# Patient Record
Sex: Female | Born: 1965 | Race: White | Hispanic: No | Marital: Married | Smoking: Never smoker
Health system: Southern US, Community
[De-identification: ages and names within clinical notes are randomized; demographics above are authoritative.]

## PROBLEM LIST (undated history)

## (undated) DIAGNOSIS — I10 Essential (primary) hypertension: Secondary | ICD-10-CM

## (undated) DIAGNOSIS — N200 Calculus of kidney: Secondary | ICD-10-CM

## (undated) HISTORY — DX: Calculus of kidney: N20.0

## (undated) HISTORY — DX: Essential (primary) hypertension: I10

---

## 1998-05-03 ENCOUNTER — Other Ambulatory Visit: Admission: RE | Admit: 1998-05-03 | Discharge: 1998-05-03 | Payer: Self-pay | Admitting: Obstetrics and Gynecology

## 1999-05-10 ENCOUNTER — Other Ambulatory Visit: Admission: RE | Admit: 1999-05-10 | Discharge: 1999-05-10 | Payer: Self-pay | Admitting: Obstetrics and Gynecology

## 2000-05-21 ENCOUNTER — Other Ambulatory Visit: Admission: RE | Admit: 2000-05-21 | Discharge: 2000-05-21 | Payer: Self-pay | Admitting: Obstetrics and Gynecology

## 2000-05-23 ENCOUNTER — Encounter: Admission: RE | Admit: 2000-05-23 | Discharge: 2000-05-23 | Payer: Self-pay | Admitting: Obstetrics and Gynecology

## 2000-05-23 ENCOUNTER — Encounter: Payer: Self-pay | Admitting: Obstetrics and Gynecology

## 2001-05-25 ENCOUNTER — Other Ambulatory Visit: Admission: RE | Admit: 2001-05-25 | Discharge: 2001-05-25 | Payer: Self-pay | Admitting: Obstetrics and Gynecology

## 2002-09-01 ENCOUNTER — Inpatient Hospital Stay (HOSPITAL_COMMUNITY): Admission: AD | Admit: 2002-09-01 | Discharge: 2002-09-03 | Payer: Self-pay | Admitting: *Deleted

## 2002-10-12 ENCOUNTER — Other Ambulatory Visit: Admission: RE | Admit: 2002-10-12 | Discharge: 2002-10-12 | Payer: Self-pay | Admitting: Gynecology

## 2003-08-22 ENCOUNTER — Emergency Department (HOSPITAL_COMMUNITY): Admission: EM | Admit: 2003-08-22 | Discharge: 2003-08-22 | Payer: Self-pay | Admitting: Emergency Medicine

## 2003-10-26 ENCOUNTER — Other Ambulatory Visit: Admission: RE | Admit: 2003-10-26 | Discharge: 2003-10-26 | Payer: Self-pay | Admitting: Gynecology

## 2004-04-25 ENCOUNTER — Other Ambulatory Visit: Admission: RE | Admit: 2004-04-25 | Discharge: 2004-04-25 | Payer: Self-pay | Admitting: Gynecology

## 2004-10-26 ENCOUNTER — Other Ambulatory Visit: Admission: RE | Admit: 2004-10-26 | Discharge: 2004-10-26 | Payer: Self-pay | Admitting: Gynecology

## 2005-11-18 ENCOUNTER — Other Ambulatory Visit: Admission: RE | Admit: 2005-11-18 | Discharge: 2005-11-18 | Payer: Self-pay | Admitting: Gynecology

## 2007-02-16 ENCOUNTER — Other Ambulatory Visit: Admission: RE | Admit: 2007-02-16 | Discharge: 2007-02-16 | Payer: Self-pay | Admitting: Gynecology

## 2007-12-09 HISTORY — PX: VAGINAL HYSTERECTOMY: SUR661

## 2008-01-07 ENCOUNTER — Encounter: Payer: Self-pay | Admitting: Gynecology

## 2008-01-07 ENCOUNTER — Ambulatory Visit (HOSPITAL_BASED_OUTPATIENT_CLINIC_OR_DEPARTMENT_OTHER): Admission: RE | Admit: 2008-01-07 | Discharge: 2008-01-07 | Payer: Self-pay | Admitting: Gynecology

## 2008-02-17 ENCOUNTER — Ambulatory Visit: Payer: Self-pay | Admitting: Gynecology

## 2008-11-25 ENCOUNTER — Other Ambulatory Visit: Admission: RE | Admit: 2008-11-25 | Discharge: 2008-11-25 | Payer: Self-pay | Admitting: Gynecology

## 2008-11-25 ENCOUNTER — Encounter: Payer: Self-pay | Admitting: Gynecology

## 2008-11-25 ENCOUNTER — Ambulatory Visit: Payer: Self-pay | Admitting: Gynecology

## 2008-12-20 ENCOUNTER — Ambulatory Visit: Payer: Self-pay | Admitting: Gynecology

## 2009-12-20 ENCOUNTER — Encounter: Admission: RE | Admit: 2009-12-20 | Discharge: 2009-12-20 | Payer: Self-pay | Admitting: Gynecology

## 2009-12-26 ENCOUNTER — Ambulatory Visit: Payer: Self-pay | Admitting: Gynecology

## 2009-12-26 ENCOUNTER — Other Ambulatory Visit: Admission: RE | Admit: 2009-12-26 | Discharge: 2009-12-26 | Payer: Self-pay | Admitting: Gynecology

## 2010-10-23 NOTE — Op Note (Signed)
NAME:  Taylor Woodard, MCMANIGAL          ACCOUNT NO.:  [id number]   MEDICAL RECORD NO.:  [id number]          PATIENT TYPE:  AMB   LOCATION:  [facility]                         FACILITY:  [facility]   PHYSICIAN:  Timothy P. Fontaine, M.D.DATE OF BIRTH:  February 22, 1966   DATE OF PROCEDURE:  01/07/2008  DATE OF DISCHARGE:                               OPERATIVE REPORT   [facility].   PREOPERATIVE DIAGNOSES:  Menorrhagia, uterine prolapse.   POSTOPERATIVE DIAGNOSES:  Menorrhagia, uterine prolapse, leiomyoma.   PROCEDURE:  Total vaginal hysterectomy, McCall culdoplasty.   SURGEON:  Timothy P. Fontaine, M.D.   ASSISTANT:  Rande Brunt. Eda Paschal, M.D.   ANESTHETIC:  General.   ESTIMATED BLOOD LOSS:  50 mL.   COMPLICATIONS:  None.   SPECIMEN:  Uterus to pathology.   FINDINGS:  EUA:  External:  BUS, vagina normal.  Cervix grossly normal.  Uterus normal size, midline and mobile.  Adnexa without masses.  Surgical:  Uterus grossly normal in size with palpable subserosal small  myomas, right and left fallopian tubes grossly normal, right and left  ovaries grossly normal, cul-de-sac to limited inspection was normal.   PROCEDURE:  The patient was taken to the operating room, underwent  general anesthesia, she was placed in the dorsal lithotomy position,  received a perineal/vaginal preparation with Betadine solution.  EUA  performed.  Bladder emptied with indwelling Foley catheterization.  The  patient draped in the usual fashion.  Cervix was grasped with a  tenaculum and the cervical mucosa was circumferentially injected using  lidocaine/epinephrine mixture submucosally, approximately 9 mL.  The  cervical mucosa was then circumferentially incised and the paracervical  plane sharply developed.  The posterior cul-de-sac was then sharply  entered.  A long weighted speculum was placed and the right and left  uterosacral ligaments were identified, clamped, cut and ligated using 0  Vicryl suture  and tagged for future reference.  The anterior cul-de-sac  was then sharply developed and ultimately entered without difficulty,  and the uterus was then progressively freed from its attachments through  clamping, cutting and ligating of the paracervical/parametrial tissues  using 0 Vicryl suture.  The uterine vessels bilaterally were identified,  clamped, cut and doubly ligated using 0 Vicryl suture.  The uterus was  then delivered through the vagina and the uterine/ovarian pedicles were  clamped bilaterally and the specimen was excised from the patient.  The  pedicles were then doubly ligated using 0 Vicryl suture.  The long  weighted speculum was replaced with a shorter weighted speculum and the  intestines were packed from the surgical site using a tagged tail  sponge.  The posterior vaginal cuff was then run from uterosacral  ligament to uterosacral ligament using 0 Vicryl suture in a running  interlocking stitch.  Again, the cul-de-sac again was irrigated,  hemostasis visualized, and a McCall culdoplasty stitch was placed using  2-0 Vicryl and tagged for future tying.  The tail sponge was then  removed, again hemostasis visualized, and the vagina was closed anterior  to posterior using 0 Vicryl suture in interrupted figure-of-eight  stitch.  The culdoplasty stitch was then tied,  the vagina irrigated,  hemostasis visualized, the patient placed in the supine position, clear  free-flowing yellow urine noted, and the patient was awakened without  difficulty and taken to the recovery room in good condition, having  tolerated the procedure well.      Timothy P. Fontaine, M.D.  Electronically Signed     TPF/MEDQ  D:  01/07/2008  T:  01/07/2008  Job:  [id number]

## 2010-10-23 NOTE — H&P (Signed)
NAME:  Taylor Woodard, Taylor Woodard          ACCOUNT NO.:  [id number]   MEDICAL RECORD NO.:  [id number]          PATIENT TYPE:  AMB   LOCATION:  [facility]                         FACILITY:  [facility]   PHYSICIAN:  Timothy P. Fontaine, M.D.DATE OF BIRTH:  June 26, 1965   DATE OF ADMISSION:  01/07/2008  DATE OF DISCHARGE:                              HISTORY & PHYSICAL   Being admitted to [facility] July 30 for  surgery.   CHIEF COMPLAINT:  Menorrhagia, uterine prolapse.   HISTORY OF PRESENT ILLNESS:  [age]year-old G2, P2 female with a  history of worsening menorrhagia, increasing dysmenorrhea with uterine  prolapse for total vaginal hysterectomy.  The patient notes that her  periods are getting heavier, more crampy as well as something protruding  from her vagina when she strains, sits on the toilet and difficulties  keeping tampons in.  Options for management were reviewed to include  nonsurgical such as nonsteroidals, low-dose oral contraceptives, Mirena  IUD, endometrial ablation up to and including hysterectomy.  The patient  wants to proceed with hysterectomy.   PAST MEDICAL HISTORY:  Significant for PMS type symptoms.   PAST SURGICAL HISTORY:  None.   CURRENT MEDICATIONS:  Sarafem 10 mg daily.   ALLERGIES:  PENICILLIN CAUSED A RASH.   REVIEW OF SYSTEMS:  Noncontributory.   FAMILY HISTORY:  Noncontributory.   SOCIAL HISTORY:  Noncontributory.   ADMISSION PHYSICAL EXAM:  Afebrile.  Vital signs stable.  HEENT: Normal.  LUNGS:  Clear.  CARDIAC:  Regular rate without rubs, murmurs or gallops.  ABDOMINAL:  Exam benign.  PELVIC:  External BUS, vagina normal.  Cervix grossly normal with  descent to the introitus with straining.  Uterus retroverted, grossly  normal in size, midline immobile, nontender.  Adnexa without masses or  tenderness.   ASSESSMENT:  [age]-year-old G2, P2 female vasectomy birth control,  increasing dysmenorrhea, menorrhagia and uterine prolapse  for total  vaginal hysterectomy.  The proposed surgery was discussed with the  patient to include alternatives, risks, benefits, indications,  postsurgical nonsurgical, and she wants to proceed with hysterectomy.  Ovarian conservation issue was reviewed with the patient and the options  of keeping both ovaries or removing both ovaries at time of surgery was  discussed.  The patient understands by keeping both ovaries that she  would keep continued hormone production, but would also keep the risk of  ovarian cancer in the future as well as benign ovarian disease requiring  reoperation.  If she removes both ovaries then she would deal with the  issues of hypoestrogenism both from a medical standpoint such as  increased risk of cardiovascular disease or bone health as well as the  issues of symptoms such as hot flashes, sleep disturbances and sweats.  The patient wants to keep both ovaries.  She understands and accepts the  risks of ovarian cancer in the future, although she does give me  permission to remove one or both ovaries if significant disease is  encountered at the time of surgery.  Sexuality following hysterectomy  was also reviewed.  The potential for persistent dyspareunia as well as  orgasmic dysfunction following the  procedure was discussed, understood  and accepted.  The absolute irreversible sterility following  hysterectomy was also discussed, understood and accepted.  The acute  intraoperative postoperative risks were reviewed to include anesthetic  complications, MI, thrombosis, pulmonary embolus and death as well as  the risks of infection requiring prolonged antibiotics, abscess  formation or hematoma formation requiring reoperation, drainage of  abscess or hematomas.  She understands that at any time during the  surgery if it is felt unsafe to proceed with the vaginal approach that  we will switch to an abdominal or laparoscopic approach with abdominal  incisions, that  these could become infected and require opening and  draining and closure by secondary intention.  The long-term issues with  abdominal incisions to include hernia formation and cosmetics was also  discussed.  The risk of hemorrhage necessitating transfusion and the  risks of transfusion including transfusion reaction, hepatitis, HIV, mad  cow disease and other unknown entities were all discussed, understood  and accepted.  The risk of inadvertent injury to internal organs  including bowel, bladder, ureters, vessels and nerves necessitating  major exploratory reparative surgeries, future reparative surgeries,  bladder repair and closure, ureteral surgery, reimplantation, bowel  resection or ostomy formation were all discussed, understood and  accepted.  The patient's questions were answered to her satisfaction and  she is ready to proceed with surgery.      Timothy P. Fontaine, M.D.  Electronically Signed     TPF/MEDQ  D:  01/05/2008  T:  01/05/2008  Job:  [id number]

## 2010-10-26 NOTE — Discharge Summary (Signed)
   NAME:  Taylor Woodard, Taylor Woodard NO.:  [id number]   MEDICAL RECORD NO.:  [id number]                   PATIENT TYPE:  INP   LOCATION:  9104                                 FACILITY:  [facility]   PHYSICIAN:  Ivor Costa. Farrel Gobble, M.D.              DATE OF BIRTH:  May 17, 1966   DATE OF ADMISSION:  09/01/2002  DATE OF DISCHARGE:  09/03/2002                                 DISCHARGE SUMMARY   DISCHARGE DIAGNOSES:  1. Intrauterine pregnancy at 40 weeks, delivered.  2. Status post spontaneous vaginal delivery.  3. Probable allergic reaction to PERCOCET.  4. Positive group B strep.   HISTORY:  A 36-years-of-age female gravida 2 para 1 with an EDC of September 01, 2002.  The patient's prenatal course had been complicated with history of  group B strep; also, advanced maternal age, declined amniocentesis.  Did  develop a thrombosed hemorrhoid which was surgically reduced by Chevis Pretty,  M.D.   HOSPITAL COURSE:  On September 01, 2002 the patient was admitted for induction  at 40 weeks with a favorable cervix.  Subsequently, on September 01, 2002 the  patient underwent a normal spontaneous vaginal delivery of a female, Apgars  of 9 and 9, weight 7 pounds 15 ounces, with a second degree vaginal/perineal  tear which was repaired.  Postpartum the patient did complain, after taking  Ambien and two Percocet, when she awoke she had a swollen uvula.  It was  better after the medication had worn off, but it was thought it was a  probable allergic reaction to Percocet.  The patient otherwise remained  afebrile, voiding, in stable condition, and was thought satisfactory to be  discharged to home on September 03, 2002.   ACCESSORY CLINICAL FINDINGS/LABORATORY DATA:  The patient is O positive,  rubella immune.  On September 02, 2002 hemoglobin 10.7.   DISPOSITION:  1. The patient is discharged to home.  2. Return to the office in six weeks.  3. Given [facility] postpartum instructions and  postpartum     booklet.  4. Informed the patient not to take anymore PERCOCET because thought to be     an allergic reaction to same.  5. If she had any problems prior to the six week visit to be seen in the     office.     Susa Loffler, P.A.                    Ivor Costa. Farrel Gobble, M.D.    TSG/MEDQ  D:  09/24/2002  T:  09/24/2002  Job:  [id number]

## 2010-10-26 NOTE — Discharge Summary (Signed)
NAME:  Taylor Woodard, Taylor Woodard          ACCOUNT NO.:  [id number]   MEDICAL RECORD NO.:  [id number]          PATIENT TYPE:  AMB   LOCATION:  [facility]                         FACILITY:  [facility]   PHYSICIAN:  Timothy P. Fontaine, M.D.DATE OF BIRTH:  1966-01-07   DATE OF ADMISSION:  01/07/2008  DATE OF DISCHARGE:  01/08/2008                               DISCHARGE SUMMARY   DISCHARGE DIAGNOSES:  1. Menorrhagia.  2. Uterine prolapse.  3. Leiomyoma.   PROCEDURE:  Total vaginal hysterectomy on January 07, 2008, pathology  pending.   HOSPITAL COURSE:  A 45 year old female with symptomatic uterine prolapse  and menorrhagia underwent uncomplicated total vaginal hysterectomy on  January 07, 2008.  Findings at the time of surgery was a normal sized  uterus with a small leiomyoma noted.  Her postoperative course was  uncomplicated.  She was discharged on postoperative day #1, ambulating  well, tolerating a regular diet, voiding without difficulty with a  postoperative hemoglobin of 10.6.  The patient was discharged home  having received precautions, instructions and follow-up.  She will be  seen in the office in 2 weeks, and was given a prescription for Dilaudid  2 mg tablets #21 p.o. q.6 h., p.r.n. pain.  She initially was prescribed  Tylox, but gave a history of questionable throat tightening on a prior  exposure, and the Dilaudid was substituted for the Tylox.      Timothy P. Fontaine, M.D.  Electronically Signed     TPF/MEDQ  D:  01/08/2008  T:  01/08/2008  Job:  [id number]

## 2010-10-26 NOTE — H&P (Signed)
   NAME:  Taylor Woodard, Taylor Woodard NO.:  [id number]   MEDICAL RECORD NO.:  [id number]                   PATIENT TYPE:  MAT   LOCATION:  [facility]                                 FACILITY:  [facility]   PHYSICIAN:  Katy Fitch, M.D.               DATE OF BIRTH:  Nov 16, 1965   DATE OF ADMISSION:  08/31/2002  DATE OF DISCHARGE:                                HISTORY & PHYSICAL   CHIEF COMPLAINT:  40 weeks with favorable cervix.   HISTORY OF PRESENT ILLNESS:  The patient is a 45 year old, G2, P1, at 40  weeks and two days, who was admitted for induction secondary to a favorable  cervix.  The patient's only prenatal complication was that she had a  thrombosed hemorrhoid which was surgically fixed by Ollen Gross. Carolynne Edouard, M.D., and  has had no problems since the 36-week visit.  The patient has advanced  maternal age, but declined amniocentesis.  She had a positive GBS culture.   PAST MEDICAL HISTORY:  Thrombosed hemorrhoid.   PAST SURGICAL HISTORY:  Surgical correction of the thrombosed hemorrhoid.   MEDICATIONS:  Prenatal vitamins.   ALLERGIES:  No known drug allergies.   SOCIAL HISTORY:  Without any tobacco, alcohol, or drugs.   FAMILY HISTORY:  Without any mental retardation or epithelial cancers.   PHYSICAL EXAMINATION:  VITAL SIGNS:  Blood pressure 112/70.  HEENT:  Throat clear.  LUNGS:  Clear to auscultation bilaterally.  HEART:  Regular rate and rhythm.  ABDOMEN:  Gravid and nontender.  Estimated fetal weight 8 pounds even.  PELVIC:  Cervix 280 and -1.   ASSESSMENT AND PLAN:  A 45 year old, G2, P1, at 40 weeks with favorable  cervix.  Admit for Pitocin induction.  Will start penicillin for GBS  prophylaxis and anticipate a spontaneous vaginal delivery.                                               Katy Fitch, M.D.    DC/MEDQ  D:  08/31/2002  T:  08/31/2002  Job:  [id number]

## 2010-12-04 ENCOUNTER — Other Ambulatory Visit: Payer: Self-pay | Admitting: Gynecology

## 2010-12-04 DIAGNOSIS — Z1231 Encounter for screening mammogram for malignant neoplasm of breast: Secondary | ICD-10-CM

## 2010-12-24 ENCOUNTER — Ambulatory Visit
Admission: RE | Admit: 2010-12-24 | Discharge: 2010-12-24 | Disposition: A | Discharge status: Home / Self Care | Payer: BC Managed Care – PPO | Source: Ambulatory Visit | Attending: Gynecology | Admitting: Gynecology

## 2010-12-24 DIAGNOSIS — Z1231 Encounter for screening mammogram for malignant neoplasm of breast: Secondary | ICD-10-CM

## 2010-12-26 ENCOUNTER — Other Ambulatory Visit: Payer: Self-pay | Admitting: Gynecology

## 2010-12-26 DIAGNOSIS — R928 Other abnormal and inconclusive findings on diagnostic imaging of breast: Secondary | ICD-10-CM

## 2011-01-01 ENCOUNTER — Other Ambulatory Visit (HOSPITAL_COMMUNITY)
Admission: RE | Admit: 2011-01-01 | Discharge: 2011-01-01 | Disposition: A | Discharge status: Home / Self Care | Payer: BC Managed Care – PPO | Source: Ambulatory Visit | Attending: Gynecology | Admitting: Gynecology

## 2011-01-01 ENCOUNTER — Encounter: Payer: Self-pay | Admitting: Gynecology

## 2011-01-01 ENCOUNTER — Ambulatory Visit
Admission: RE | Admit: 2011-01-01 | Discharge: 2011-01-01 | Disposition: A | Discharge status: Home / Self Care | Payer: BC Managed Care – PPO | Source: Ambulatory Visit | Attending: Gynecology | Admitting: Gynecology

## 2011-01-01 ENCOUNTER — Ambulatory Visit (INDEPENDENT_AMBULATORY_CARE_PROVIDER_SITE_OTHER): Payer: BC Managed Care – PPO | Admitting: Gynecology

## 2011-01-01 VITALS — BP 112/70 | Ht 63.75 in | Wt 149.0 lb

## 2011-01-01 DIAGNOSIS — R823 Hemoglobinuria: Secondary | ICD-10-CM

## 2011-01-01 DIAGNOSIS — Z13220 Encounter for screening for lipoid disorders: Secondary | ICD-10-CM

## 2011-01-01 DIAGNOSIS — F419 Anxiety disorder, unspecified: Secondary | ICD-10-CM

## 2011-01-01 DIAGNOSIS — Z01419 Encounter for gynecological examination (general) (routine) without abnormal findings: Secondary | ICD-10-CM

## 2011-01-01 DIAGNOSIS — Z124 Encounter for screening for malignant neoplasm of cervix: Secondary | ICD-10-CM

## 2011-01-01 DIAGNOSIS — R928 Other abnormal and inconclusive findings on diagnostic imaging of breast: Secondary | ICD-10-CM

## 2011-01-01 DIAGNOSIS — Z833 Family history of diabetes mellitus: Secondary | ICD-10-CM

## 2011-01-01 MED ORDER — FLUOXETINE HCL 10 MG PO CAPS: 10.0000 mg | ORAL_CAPSULE | Freq: Every day | ORAL | Status: DC

## 2011-01-01 NOTE — Progress Notes (Signed)
Addended by: Cammie Mcgee T on: 01/01/2011 04:24 PM   Modules accepted: Orders

## 2011-01-01 NOTE — Progress Notes (Signed)
Taylor Woodard 08-Aug-1965 [id number]    History:    The patient presents for annual exam.  She is otherwise doing well on fluoxetine 10 mg daily without complaints.   Past medical history, past surgical history, family history and social history were all reviewed and documented in the EPIC chart.   ROS:  A 14 point ROS was performed and pertinent positives and negatives are included in the history.  Exam:  Filed Vitals:   01/01/11 1054  BP: 112/70    General appearance:  Normal Head/Neck:  Normal, without cervical or supraclavicular adenopathy. Thyroid:  Symmetrical, normal in size, without palpable masses or nodularity. Respiratory  Effort:  Normal  Auscultation:  Clear without wheezing or rhonchi Cardiovascular  Auscultation:  Regular rate, without rubs, murmurs or gallops  Edema/varicosities:  Not grossly evident Abdominal  Masses/tenderness:  Soft,nontender, without masses, guarding or rebound.  Liver/spleen:  No organomegaly noted  Hernia:  None appreciated  Occult test:   Skin  Inspection:  Grossly normal  Palpation:  Grossly normal Neurologic/psychiatric  Orientation:  Normal with appropriate conversation.  Mood/affect:  Normal  Genitourinary with chaperone present    Breasts: Examined lying and sitting.     Right: Without masses, retractions, discharge or axillary adenopathy.     Left: Without masses, retractions, discharge or axillary adenopathy.   Inguinal/mons:  Normal without inguinal adenopathy  External genitalia:  Normal  BUS/Urethra/Skene's glands:  Normal  Bladder:  Normal  Vagina:  Normal  Cervix:  absent  Uterus:  absent  Adnexa/parametria:     Rt: Without masses or tenderness.   Lt: Without masses or tenderness.  Anus and perineum: Normal  Digital rectal exam: Normal sphincter tone without palpated masses or tenderness.   Assessment/Plan:  44 y.o. year old female for annual exam doing well.  I will refill her fluoxetine 10 mg daily if  she's doing well with this x1 year. Recently had screening mammogram and as a call back for relook on her right breast and she has a scheduled followup with them. Her breast exam today is normal she'll continue with self breast exams on monthly basis.  Does have a maternal history of colon cancer and actually we'll be planning a colonoscopy within the next year. Increase calcium vitamin D reviewed. Since she continues well then we'll see her in a year sooner as needed    Dara Lords MD, 11:20 AM 01/01/2011

## 2011-03-08 LAB — CBC
MCV: 88.6
Platelets: 326
RBC: 3.56 — ABNORMAL LOW
WBC: 13 — ABNORMAL HIGH

## 2011-11-26 ENCOUNTER — Other Ambulatory Visit: Payer: Self-pay | Admitting: Gynecology

## 2011-11-26 DIAGNOSIS — Z1231 Encounter for screening mammogram for malignant neoplasm of breast: Secondary | ICD-10-CM

## 2011-12-25 ENCOUNTER — Ambulatory Visit
Admission: RE | Admit: 2011-12-25 | Discharge: 2011-12-25 | Disposition: A | Discharge status: Home / Self Care | Payer: BC Managed Care – PPO | Source: Ambulatory Visit | Attending: Gynecology | Admitting: Gynecology

## 2011-12-25 DIAGNOSIS — Z1231 Encounter for screening mammogram for malignant neoplasm of breast: Secondary | ICD-10-CM

## 2012-01-02 ENCOUNTER — Ambulatory Visit (INDEPENDENT_AMBULATORY_CARE_PROVIDER_SITE_OTHER): Payer: BC Managed Care – PPO | Admitting: Gynecology

## 2012-01-02 ENCOUNTER — Encounter: Payer: BC Managed Care – PPO | Admitting: Gynecology

## 2012-01-02 ENCOUNTER — Encounter: Payer: Self-pay | Admitting: Gynecology

## 2012-01-02 VITALS — BP 120/74 | Ht 63.5 in | Wt 147.0 lb

## 2012-01-02 DIAGNOSIS — F411 Generalized anxiety disorder: Secondary | ICD-10-CM

## 2012-01-02 DIAGNOSIS — Z01419 Encounter for gynecological examination (general) (routine) without abnormal findings: Secondary | ICD-10-CM

## 2012-01-02 DIAGNOSIS — F419 Anxiety disorder, unspecified: Secondary | ICD-10-CM

## 2012-01-02 MED ORDER — FLUOXETINE HCL 10 MG PO CAPS: 10.0000 mg | ORAL_CAPSULE | Freq: Every day | ORAL | Status: DC

## 2012-01-02 NOTE — Patient Instructions (Signed)
Follow up in one year for annual gynecologic exam. 

## 2012-01-02 NOTE — Progress Notes (Signed)
Taylor Woodard 1966/03/26 [id number]        46 y.o.  [id number] for annual exam.  No complaints.  Past medical history,surgical history, medications, allergies, family history and social history were all reviewed and documented in the EPIC chart. ROS:  Was performed and pertinent positives and negatives are included in the history.  Exam: [name] assistant Filed Vitals:   01/02/12 0948  BP: 120/74  Height: 5' 3.5" (1.613 m)  Weight: 147 lb (66.679 kg)   General appearance  Normal Skin grossly normal Head/Neck normal with no cervical or supraclavicular adenopathy thyroid normal Lungs  Clear, small cutaneous nodule upper anterior left chest skin Cardiac RR, without RMG Abdominal  soft, nontender, without masses, organomegaly or hernia Breasts  examined lying and sitting without masses, retractions, discharge or axillary adenopathy. Pelvic  Ext/BUS/vagina  normal   Adnexa  Without masses or tenderness    Anus and perineum  normal   Rectovaginal  normal sphincter tone without palpated masses or tenderness.    Assessment/Plan:  46 y.o. [id number] female for annual exam.   1. Status post TVH for menorrhagia/prolapse. Doing well. 2. Mild anxiety. On fluoxetine 10 mg doing well wants to continue. Refill x1 year. 3. Small cutaneous nodule left anterior chest skin. Patient had evaluated and was told it was a hemangioma. She'll continue to monitor. 4. Pap smear. Last Pap smear 2012. No history of abnormal Pap smears before. Review current screening guidelines. She is status post hysterectomy for benign indications. No Pap done today, plan no further screening/less frequent interval and we'll rediscuss annually. 5. Mammography.  Patient just had her mammogram. We'll continue with annual mammography. SBE monthly reviewed. 6. Colonoscopy. Patient's mother died of colon cancer diagnosed age 63.  Recommended start screening at age 19. 7. Health maintenance. Patient had a normal glucose, lipid profile,  CBC urinalysis last year. Will not repeat this year. She will see me in a year, sooner as needed.    Dara Lords MD, 10:16 AM 01/02/2012

## 2012-01-03 LAB — URINALYSIS W MICROSCOPIC + REFLEX CULTURE
Casts: NONE SEEN
Glucose, UA: NEGATIVE mg/dL
Leukocytes, UA: NEGATIVE
Nitrite: NEGATIVE
Protein, ur: NEGATIVE mg/dL
Squamous Epithelial / LPF: NONE SEEN
pH: 5.5 (ref 5.0–8.0)

## 2012-01-11 ENCOUNTER — Other Ambulatory Visit: Payer: Self-pay | Admitting: Gynecology

## 2012-04-24 ENCOUNTER — Ambulatory Visit
Admission: RE | Admit: 2012-04-24 | Discharge: 2012-04-24 | Disposition: A | Discharge status: Home / Self Care | Payer: BC Managed Care – PPO | Source: Ambulatory Visit | Attending: Family Medicine | Admitting: Family Medicine

## 2012-04-24 ENCOUNTER — Other Ambulatory Visit: Payer: Self-pay | Admitting: Family Medicine

## 2012-04-24 DIAGNOSIS — M549 Dorsalgia, unspecified: Secondary | ICD-10-CM

## 2012-04-24 DIAGNOSIS — R109 Unspecified abdominal pain: Secondary | ICD-10-CM

## 2012-11-11 ENCOUNTER — Ambulatory Visit
Admission: RE | Admit: 2012-11-11 | Discharge: 2012-11-11 | Disposition: A | Discharge status: Home / Self Care | Payer: BC Managed Care – PPO | Source: Ambulatory Visit

## 2012-11-11 ENCOUNTER — Other Ambulatory Visit: Payer: Self-pay

## 2012-11-11 DIAGNOSIS — N200 Calculus of kidney: Secondary | ICD-10-CM

## 2012-11-11 DIAGNOSIS — R52 Pain, unspecified: Secondary | ICD-10-CM

## 2012-12-04 ENCOUNTER — Other Ambulatory Visit: Payer: Self-pay

## 2012-12-04 DIAGNOSIS — Z1231 Encounter for screening mammogram for malignant neoplasm of breast: Secondary | ICD-10-CM

## 2012-12-28 ENCOUNTER — Ambulatory Visit
Admission: RE | Admit: 2012-12-28 | Discharge: 2012-12-28 | Disposition: A | Discharge status: Home / Self Care | Payer: BC Managed Care – PPO | Source: Ambulatory Visit

## 2012-12-28 DIAGNOSIS — Z1231 Encounter for screening mammogram for malignant neoplasm of breast: Secondary | ICD-10-CM

## 2013-01-07 ENCOUNTER — Encounter: Payer: Self-pay | Admitting: Gynecology

## 2013-01-07 ENCOUNTER — Ambulatory Visit (INDEPENDENT_AMBULATORY_CARE_PROVIDER_SITE_OTHER): Payer: BC Managed Care – PPO | Admitting: Gynecology

## 2013-01-07 ENCOUNTER — Other Ambulatory Visit: Payer: Self-pay

## 2013-01-07 VITALS — BP 140/88 | Ht 64.0 in | Wt 130.0 lb

## 2013-01-07 DIAGNOSIS — Z01419 Encounter for gynecological examination (general) (routine) without abnormal findings: Secondary | ICD-10-CM

## 2013-01-07 DIAGNOSIS — Z13220 Encounter for screening for lipoid disorders: Secondary | ICD-10-CM

## 2013-01-07 DIAGNOSIS — N83209 Unspecified ovarian cyst, unspecified side: Secondary | ICD-10-CM

## 2013-01-07 LAB — CBC WITH DIFFERENTIAL/PLATELET
Basophils Absolute: 0 10*3/uL (ref 0.0–0.1)
Basophils Relative: 1 % (ref 0–1)
Eosinophils Absolute: 0.1 10*3/uL (ref 0.0–0.7)
Eosinophils Relative: 2 % (ref 0–5)
HCT: 42.8 % (ref 36.0–46.0)
Lymphocytes Relative: 20 % (ref 12–46)
MCHC: 33.9 g/dL (ref 30.0–36.0)
MCV: 90.7 fL (ref 78.0–100.0)
Monocytes Absolute: 0.5 10*3/uL (ref 0.1–1.0)
Platelets: 320 10*3/uL (ref 150–400)
RDW: 13.5 % (ref 11.5–15.5)
WBC: 6.5 10*3/uL (ref 4.0–10.5)

## 2013-01-07 LAB — COMPREHENSIVE METABOLIC PANEL
ALT: 10 U/L (ref 0–35)
AST: 13 U/L (ref 0–37)
Alkaline Phosphatase: 75 U/L (ref 39–117)
BUN: 17 mg/dL (ref 6–23)
Calcium: 9.4 mg/dL (ref 8.4–10.5)
Creat: 0.63 mg/dL (ref 0.50–1.10)
Total Bilirubin: 0.5 mg/dL (ref 0.3–1.2)

## 2013-01-07 LAB — LIPID PANEL
HDL: 63 mg/dL (ref >39)
LDL Cholesterol: 102 mg/dL — ABNORMAL HIGH (ref 0–99)
Total CHOL/HDL Ratio: 2.8 Ratio
VLDL: 9 mg/dL (ref 0–40)

## 2013-01-07 LAB — TSH: TSH: 1.98 u[IU]/mL (ref 0.350–4.500)

## 2013-01-07 MED ORDER — FLUOXETINE HCL 10 MG PO CAPS: 10.0000 mg | ORAL_CAPSULE | Freq: Every day | ORAL | Status: DC

## 2013-01-07 NOTE — Patient Instructions (Signed)
follow up for ultrasound as scheduled Monitor blood pressure.  If it remains elevated follow up with your primary care MD

## 2013-01-07 NOTE — Progress Notes (Signed)
Taylor Woodard 1965-08-16 [id number]        47 y.o.  [id number] for annual exam.  Several issues noted below.  Past medical history,surgical history, medications, allergies, family history and social history were all reviewed and documented in the EPIC chart.  ROS:  Performed and pertinent positives and negatives are included in the history, assessment and plan .  Exam: [name] assistant Filed Vitals:   01/07/13 0937  BP: 140/88  Height: 5\' 4"  (1.626 m)  Weight: 130 lb (58.968 kg)   General appearance  Normal Skin grossly normal Head/Neck normal with no cervical or supraclavicular adenopathy thyroid normal Lungs  clear Cardiac RR, without RMG Abdominal  soft, nontender, without masses, organomegaly or hernia Breasts  examined lying and sitting without masses, retractions, discharge or axillary adenopathy. Pelvic  Ext/BUS/vagina  normal    Adnexa  Without masses or tenderness    Anus and perineum  normal   Rectovaginal  normal sphincter tone without palpated masses or tenderness.    Assessment/Plan:  47 y.o. [id number] female for annual exam.   1. Borderline hypertension. Her pressure is 140/88. She noticed it was elevated the last time she had it checked at another facility. Recommended she buy a cuff and follow at home. It remains elevated she needs to follow up with her primary physician for further evaluation and possible treatment. 2. Left ovarian cyst. Patient had recent CT scan for evaluation of abdominal pain. Was found to have renal calculi but also to probable left ovarian cysts. There's her pain is improving. Recommend ultrasound now for resolution/better definition of the adnexa. Patient will schedule a followup for this. 3. Anxiety. Patient continues on Lexapro 10 mg daily doing well and wants to continue. I refilled her x1 year. 4. Pap smear 2012. No Pap smear done today. No history of abnormal Pap smears. Status post hysterectomy for benign indications. Options to stop  screening altogether versus less frequent screening intervals reviewed. We'll readdress on an annual basis. 5. Mammography 12/2012. Continue with annual mammography. SBE monthly review. 6. Mother is history of colon cancer diagnosis/died in her 73s. Recommended she followup with gastroenterology now and asked them when to start screening. She has already thought about this and is going to see Dr. Kinnie Scales. 7. Health maintenance. Baseline CBC lipid profile comprehensive metabolic panel urinalysis and TSH ordered. Followup for ultrasound, otherwise annually  Note: This document was prepared with digital dictation and possible smart phrase technology. Any transcriptional errors that result from this process are unintentional.   Dara Lords MD, 10:04 AM 01/07/2013

## 2013-01-08 LAB — URINALYSIS W MICROSCOPIC + REFLEX CULTURE
Glucose, UA: NEGATIVE mg/dL
Hgb urine dipstick: NEGATIVE
Leukocytes, UA: NEGATIVE
Protein, ur: NEGATIVE mg/dL
Urobilinogen, UA: 0.2 mg/dL (ref 0.0–1.0)

## 2013-01-20 ENCOUNTER — Encounter: Payer: Self-pay | Admitting: Gynecology

## 2013-01-20 ENCOUNTER — Other Ambulatory Visit: Payer: Self-pay | Admitting: Gynecology

## 2013-01-20 ENCOUNTER — Ambulatory Visit (INDEPENDENT_AMBULATORY_CARE_PROVIDER_SITE_OTHER): Payer: BC Managed Care – PPO | Admitting: Gynecology

## 2013-01-20 ENCOUNTER — Ambulatory Visit (INDEPENDENT_AMBULATORY_CARE_PROVIDER_SITE_OTHER): Payer: BC Managed Care – PPO

## 2013-01-20 DIAGNOSIS — R102 Pelvic and perineal pain: Secondary | ICD-10-CM

## 2013-01-20 DIAGNOSIS — N83209 Unspecified ovarian cyst, unspecified side: Secondary | ICD-10-CM

## 2013-01-20 DIAGNOSIS — N831 Corpus luteum cyst of ovary, unspecified side: Secondary | ICD-10-CM

## 2013-01-20 DIAGNOSIS — N8300 Follicular cyst of ovary, unspecified side: Secondary | ICD-10-CM

## 2013-01-20 DIAGNOSIS — N839 Noninflammatory disorder of ovary, fallopian tube and broad ligament, unspecified: Secondary | ICD-10-CM

## 2013-01-20 DIAGNOSIS — N949 Unspecified condition associated with female genital organs and menstrual cycle: Secondary | ICD-10-CM

## 2013-01-20 NOTE — Progress Notes (Signed)
Patient presents for followup ultrasound. Had CT scan early June for abdominal pain where they found renal lithiasis but also described to probable left ovarian cysts measuring 30 and 26 mm. Her pain has improved but has not totally gone away. More nagging in nature.  Ultrasound today status post hysterectomy shows right ovarian echo-free follicle 16 x 9 mm. Left ovary with thick walled cyst with peripheral flow 25 mm suggestive of a corpus luteum. Cul-de-sac negative.  Assessment and plan: Probable bilateral physiologic changes in her ovaries. Left ovarian corpus luteum smaller than what was described in June. Did discuss possible endometrioma. Options to include laparoscopy now due to her discomfort rule out endometriosis, observation with followup if pain persists for more aggressive approach or repeat ultrasound in 2 months relook at her ovary to make sure all changes result in if not or pain persists laparoscopy. Patient would prefer observation and followup if her pain would persist I think this is certainly reasonable. Otherwise if her pain does resolve then she'll followup when she is due for her annual exam.

## 2013-01-20 NOTE — Patient Instructions (Signed)
Call if your pain persists. Otherwise followup routinely.

## 2013-11-23 ENCOUNTER — Other Ambulatory Visit: Payer: Self-pay

## 2013-11-23 DIAGNOSIS — Z1231 Encounter for screening mammogram for malignant neoplasm of breast: Secondary | ICD-10-CM

## 2014-01-03 ENCOUNTER — Ambulatory Visit
Admission: RE | Admit: 2014-01-03 | Discharge: 2014-01-03 | Disposition: A | Discharge status: Home / Self Care | Payer: BC Managed Care – PPO | Source: Ambulatory Visit

## 2014-01-03 DIAGNOSIS — Z1231 Encounter for screening mammogram for malignant neoplasm of breast: Secondary | ICD-10-CM

## 2014-01-04 ENCOUNTER — Other Ambulatory Visit: Payer: Self-pay | Admitting: Gynecology

## 2014-01-04 DIAGNOSIS — R928 Other abnormal and inconclusive findings on diagnostic imaging of breast: Secondary | ICD-10-CM

## 2014-01-10 ENCOUNTER — Ambulatory Visit (INDEPENDENT_AMBULATORY_CARE_PROVIDER_SITE_OTHER): Payer: BC Managed Care – PPO | Admitting: Gynecology

## 2014-01-10 ENCOUNTER — Ambulatory Visit
Admission: RE | Admit: 2014-01-10 | Discharge: 2014-01-10 | Disposition: A | Discharge status: Home / Self Care | Payer: BC Managed Care – PPO | Source: Ambulatory Visit | Attending: Gynecology | Admitting: Gynecology

## 2014-01-10 ENCOUNTER — Other Ambulatory Visit (HOSPITAL_COMMUNITY)
Admission: RE | Admit: 2014-01-10 | Discharge: 2014-01-10 | Disposition: A | Discharge status: Home / Self Care | Payer: BC Managed Care – PPO | Source: Ambulatory Visit | Attending: Gynecology | Admitting: Gynecology

## 2014-01-10 ENCOUNTER — Encounter: Payer: Self-pay | Admitting: Gynecology

## 2014-01-10 VITALS — BP 118/74 | Ht 64.0 in | Wt 136.0 lb

## 2014-01-10 DIAGNOSIS — R928 Other abnormal and inconclusive findings on diagnostic imaging of breast: Secondary | ICD-10-CM

## 2014-01-10 DIAGNOSIS — Z01419 Encounter for gynecological examination (general) (routine) without abnormal findings: Secondary | ICD-10-CM

## 2014-01-10 MED ORDER — FLUOXETINE HCL 10 MG PO CAPS: 10.0000 mg | ORAL_CAPSULE | Freq: Every day | ORAL | Status: AC

## 2014-01-10 NOTE — Addendum Note (Signed)
Addended by: Dayna BarkerGARDNER, Aurora Rody K on: 01/10/2014 11:02 AM   Modules accepted: Orders

## 2014-01-10 NOTE — Patient Instructions (Signed)
Followup in one year for annual exam, sooner as needed.  You may obtain a copy of any labs that were done today by logging onto MyChart as outlined in the instructions provided with your AVS (after visit summary). The office will not call with normal lab results but certainly if there are any significant abnormalities then we will contact you.   Health Maintenance, Female A healthy lifestyle and preventative care can promote health and wellness.  Maintain regular health, dental, and eye exams.  Eat a healthy diet. Foods like vegetables, fruits, whole grains, low-fat dairy products, and lean protein foods contain the nutrients you need without too many calories. Decrease your intake of foods high in solid fats, added sugars, and salt. Get information about a proper diet from your caregiver, if necessary.  Regular physical exercise is one of the most important things you can do for your health. Most adults should get at least 150 minutes of moderate-intensity exercise (any activity that increases your heart rate and causes you to sweat) each week. In addition, most adults need muscle-strengthening exercises on 2 or more days a week.   Maintain a healthy weight. The body mass index (BMI) is a screening tool to identify possible weight problems. It provides an estimate of body fat based on height and weight. Your caregiver can help determine your BMI, and can help you achieve or maintain a healthy weight. For adults 20 years and older:  A BMI below 18.5 is considered underweight.  A BMI of 18.5 to 24.9 is normal.  A BMI of 25 to 29.9 is considered overweight.  A BMI of 30 and above is considered obese.  Maintain normal blood lipids and cholesterol by exercising and minimizing your intake of saturated fat. Eat a balanced diet with plenty of fruits and vegetables. Blood tests for lipids and cholesterol should begin at age 28 and be repeated every 5 years. If your lipid or cholesterol levels are  high, you are over 50, or you are a high risk for heart disease, you may need your cholesterol levels checked more frequently.Ongoing high lipid and cholesterol levels should be treated with medicines if diet and exercise are not effective.  If you smoke, find out from your caregiver how to quit. If you do not use tobacco, do not start.  Lung cancer screening is recommended for adults aged 25 80 years who are at high risk for developing lung cancer because of a history of smoking. Yearly low-dose computed tomography (CT) is recommended for people who have at least a 30-pack-year history of smoking and are a current smoker or have quit within the past 15 years. A pack year of smoking is smoking an average of 1 pack of cigarettes a day for 1 year (for example: 1 pack a day for 30 years or 2 packs a day for 15 years). Yearly screening should continue until the smoker has stopped smoking for at least 15 years. Yearly screening should also be stopped for people who develop a health problem that would prevent them from having lung cancer treatment.  If you are pregnant, do not drink alcohol. If you are breastfeeding, be very cautious about drinking alcohol. If you are not pregnant and choose to drink alcohol, do not exceed 1 drink per day. One drink is considered to be 12 ounces (355 mL) of beer, 5 ounces (148 mL) of wine, or 1.5 ounces (44 mL) of liquor.  Avoid use of street drugs. Do not share needles with anyone.  Ask for help if you need support or instructions about stopping the use of drugs.  High blood pressure causes heart disease and increases the risk of stroke. Blood pressure should be checked at least every 1 to 2 years. Ongoing high blood pressure should be treated with medicines, if weight loss and exercise are not effective.  If you are 88 to 47 years old, ask your caregiver if you should take aspirin to prevent strokes.  Diabetes screening involves taking a blood sample to check your fasting  blood sugar level. This should be done once every 3 years, after age 77, if you are within normal weight and without risk factors for diabetes. Testing should be considered at a younger age or be carried out more frequently if you are overweight and have at least 1 risk factor for diabetes.  Breast cancer screening is essential preventative care for women. You should practice "breast self-awareness." This means understanding the normal appearance and feel of your breasts and may include breast self-examination. Any changes detected, no matter how small, should be reported to a caregiver. Women in their 26s and 30s should have a clinical breast exam (CBE) by a caregiver as part of a regular health exam every 1 to 3 years. After age 32, women should have a CBE every year. Starting at age 34, women should consider having a mammogram (breast X-ray) every year. Women who have a family history of breast cancer should talk to their caregiver about genetic screening. Women at a high risk of breast cancer should talk to their caregiver about having an MRI and a mammogram every year.  Breast cancer gene (BRCA)-related cancer risk assessment is recommended for women who have family members with BRCA-related cancers. BRCA-related cancers include breast, ovarian, tubal, and peritoneal cancers. Having family members with these cancers may be associated with an increased risk for harmful changes (mutations) in the breast cancer genes BRCA1 and BRCA2. Results of the assessment will determine the need for genetic counseling and BRCA1 and BRCA2 testing.  The Pap test is a screening test for cervical cancer. Women should have a Pap test starting at age 28. Between ages 48 and 67, Pap tests should be repeated every 2 years. Beginning at age 20, you should have a Pap test every 3 years as long as the past 3 Pap tests have been normal. If you had a hysterectomy for a problem that was not cancer or a condition that could lead to  cancer, then you no longer need Pap tests. If you are between ages 55 and 63, and you have had normal Pap tests going back 10 years, you no longer need Pap tests. If you have had past treatment for cervical cancer or a condition that could lead to cancer, you need Pap tests and screening for cancer for at least 20 years after your treatment. If Pap tests have been discontinued, risk factors (such as a new sexual partner) need to be reassessed to determine if screening should be resumed. Some women have medical problems that increase the chance of getting cervical cancer. In these cases, your caregiver may recommend more frequent screening and Pap tests.  The human papillomavirus (HPV) test is an additional test that may be used for cervical cancer screening. The HPV test looks for the virus that can cause the cell changes on the cervix. The cells collected during the Pap test can be tested for HPV. The HPV test could be used to screen women aged 28 years  and older, and should be used in women of any age who have unclear Pap test results. After the age of 85, women should have HPV testing at the same frequency as a Pap test.  Colorectal cancer can be detected and often prevented. Most routine colorectal cancer screening begins at the age of 61 and continues through age 55. However, your caregiver may recommend screening at an earlier age if you have risk factors for colon cancer. On a yearly basis, your caregiver may provide home test kits to check for hidden blood in the stool. Use of a small camera at the end of a tube, to directly examine the colon (sigmoidoscopy or colonoscopy), can detect the earliest forms of colorectal cancer. Talk to your caregiver about this at age 56, when routine screening begins. Direct examination of the colon should be repeated every 5 to 10 years through age 20, unless early forms of pre-cancerous polyps or small growths are found.  Hepatitis C blood testing is recommended for  all people born from [birth year] through 1964 and any individual with known risks for hepatitis C.  Practice safe sex. Use condoms and avoid high-risk sexual practices to reduce the spread of sexually transmitted infections (STIs). Sexually active women aged 68 and younger should be checked for Chlamydia, which is a common sexually transmitted infection. Older women with new or multiple partners should also be tested for Chlamydia. Testing for other STIs is recommended if you are sexually active and at increased risk.  Osteoporosis is a disease in which the bones lose minerals and strength with aging. This can result in serious bone fractures. The risk of osteoporosis can be identified using a bone density scan. Women ages 42 and over and women at risk for fractures or osteoporosis should discuss screening with their caregivers. Ask your caregiver whether you should be taking a calcium supplement or vitamin D to reduce the rate of osteoporosis.  Menopause can be associated with physical symptoms and risks. Hormone replacement therapy is available to decrease symptoms and risks. You should talk to your caregiver about whether hormone replacement therapy is right for you.  Use sunscreen. Apply sunscreen liberally and repeatedly throughout the day. You should seek shade when your shadow is shorter than you. Protect yourself by wearing long sleeves, pants, a wide-brimmed hat, and sunglasses year round, whenever you are outdoors.  Notify your caregiver of new moles or changes in moles, especially if there is a change in shape or color. Also notify your caregiver if a mole is larger than the size of a pencil eraser.  Stay current with your immunizations. Document Released: 12/10/2010 Document Revised: 09/21/2012 Document Reviewed: 12/10/2010 [facility] Patient Information 2014 [place].

## 2014-01-10 NOTE — Progress Notes (Signed)
Taylor Woodard 1965-07-24 [id number]        48 y.o.  [id number] for annual exam.  Doing well.  Past medical history,surgical history, problem list, medications, allergies, family history and social history were all reviewed and documented as reviewed in the EPIC chart.  ROS:  12 system ROS performed with pertinent positives and negatives included in the history, assessment and plan.   Additional significant findings :  None   Exam: [name] person[facility]istant Filed Vitals:   01/10/14 0941  BP: 118/74  Height: 5\' 4"  (1.626 m)  Weight: 136 lb (61.689 kg)   General appearance:  Normal affect, orientation and appearance. Skin: Grossly normal HEENT: Without gross lesions.  No cervical or supraclavicular adenopathy. Thyroid normal.  Lungs:  Clear without wheezing, rales or rhonchi Cardiac: RR, without RMG Abdominal:  Soft, nontender, without masses, guarding, rebound, organomegaly or hernia Breasts:  Examined lying and sitting without masses, retractions, discharge or axillary adenopathy. Pelvic:  Ext/BUS/vagina normal. Pap of cuff done  Adnexa  Without masses or tenderness    Anus and perineum  Normal   Rectovaginal  Normal sphincter tone without palpated masses or tenderness.    Assessment/Plan:  48 y.o. [id number] female for annual exam.   1. Status post TVH for menorrhagia and prolapse. Doing well without issues or symptoms to suggest menopause. Continue to monitor. 2. Pap smear 2012. Pap of vaginal cuff today. Reviewed current screening guidelines. Options to stop screening altogether as she is status post hysterectomy for benign indications with no history of significant abnormalities previously versus less frequent screening intervals reviewed. Will readdress on an annual basis. 3. Mammography 12/2013. She got a call back and has a followup study scheduled today due to shadow. We'll readdress based on these results. Various scenarios reviewed with her. SBE monthly reviewed. 4. Colon cancer in her  mother in her mid 3960s. Patient plans to start screening at age 48. 5. History of physiologic appearing ovarian cysts last year. She was having pain which has totally resolved and she is doing well. Exam is normal we'll plan expectant management. 6. Anxiety. Patient is on fluoxetine 10 mg daily. Doing well with this and wants to continue. I refilled her x1 year. 7. Health maintenance. Patient is being followed for hypertension. Has had routine blood work recently done at her primary physician's office. No blood work done today. Followup in one year, sooner as needed.   Note: This document was prepared with digital dictation and possible smart phrase technology. Any transcriptional errors that result from this process are unintentional.   Dara LordsFONTAINE,Omair Dettmer P MD, 10:06 AM 01/10/2014

## 2014-01-11 LAB — URINALYSIS W MICROSCOPIC + REFLEX CULTURE
BACTERIA UA: NONE SEEN
Bilirubin Urine: NEGATIVE
CASTS: NONE SEEN
CRYSTALS: NONE SEEN
Glucose, UA: NEGATIVE mg/dL
Ketones, ur: NEGATIVE mg/dL
Leukocytes, UA: NEGATIVE
NITRITE: NEGATIVE
Protein, ur: NEGATIVE mg/dL
SPECIFIC GRAVITY, URINE: 1.005 (ref 1.005–1.030)
SQUAMOUS EPITHELIAL / LPF: NONE SEEN
Urobilinogen, UA: 0.2 mg/dL (ref 0.0–1.0)
pH: 6.5 (ref 5.0–8.0)

## 2014-01-11 LAB — CYTOLOGY - PAP

## 2014-04-11 ENCOUNTER — Encounter: Payer: Self-pay | Admitting: Gynecology

## 2014-06-27 ENCOUNTER — Telehealth: Payer: Self-pay | Admitting: *Deleted

## 2014-06-27 NOTE — Telephone Encounter (Signed)
Pt called c/o right breast lump advised OV best.

## 2014-06-30 ENCOUNTER — Encounter: Payer: Self-pay | Admitting: Gynecology

## 2014-06-30 ENCOUNTER — Ambulatory Visit (INDEPENDENT_AMBULATORY_CARE_PROVIDER_SITE_OTHER): Payer: BLUE CROSS/BLUE SHIELD | Admitting: Gynecology

## 2014-06-30 VITALS — BP 120/76

## 2014-06-30 DIAGNOSIS — N6001 Solitary cyst of right breast: Secondary | ICD-10-CM

## 2014-06-30 NOTE — Progress Notes (Signed)
Taylor Woodard 08/07/1965 [id number]        [age] y.o.  [id number] Presents complaining of a palpable mass in her right breast.  Had mammogram with follow up ultrasound 01/2014 with a described a elongated cyst 9:30 position right breast 6 cm from the nipple measuring  2.6 cm x 0.8 cm x 2.1 cm. They had recommended a follow up mammogram in one year.  Patient had not felt anything previously but now does feel something.  Past medical history,surgical history, problem list, medications, allergies, family history and social history were all reviewed and documented in the EPIC chart.  Directed ROS with pertinent positives and negatives documented in the history of present illness/assessment and plan.  Exam: [name] assistant General appearance:  Normal Both breast examined lying and sitting. Left breast without masses, retractions, discharge, adenopathy. Right breast with palpable circumscribed nodule to plus centimeters 9:00 position 6 cm from the nipple. No overlying skin changes, nipple discharge or or axillary adenopathy.  Procedure: The skin overlying the cyst was cleansed with alcohol,  Infiltrated with 1% lidocaine and the cyst was aspirated and completely emptied with approximately 3-4 cc of clear yellow fluid. The palpable area completely resolved.  The fluid was discarded.  Assessment/Plan:  49 y.o. [id number] with breast cyst as described above. Patient will continue with SBE monthly and report any recurrent palpable abnormalities.     Dara LordsFONTAINE,Nitza Schmid P MD, 4:27 PM 06/30/2014

## 2014-06-30 NOTE — Patient Instructions (Signed)
Follow up if any recurrence of the breast masses.  Breast Cyst Drainage, Care After Refer to this sheet in the next few weeks. These instructions provide you with information on caring for yourself after your procedure. Your health care provider may also give you more specific instructions. Your treatment has been planned according to current medical practices, but problems sometimes occur. Call your health care provider if you have any problems or questions after your procedure. WHAT TO EXPECT AFTER THE PROCEDURE After your procedure, it is typical to have the following:   The area where the needle was inserted will be a little numb for a while.  There may be bruising around the needle insertion site. The bruising will go away in a couple of days. HOME CARE INSTRUCTIONS  Only take over-the-counter or prescription medicines for pain as directed by your health care provider.  Follow your health care provider's instructions on how to care for your breast.  Make an appointment for follow-up care. Your health care provider will probably want to see you 4-6 weeks after your procedure to make sure the cyst has not filled up with fluid again. Make sure to keep this appointment. Cysts that refill with fluid can cause problems.  If the fluid in the cyst was sent for testing and your test results are not back during your visit, make an appointment with your health care provider to find out the results. Do not assume everything is normal if you have not heard from your health care provider or the medical facility. It is important for you to follow up on all of your test results. SEEK MEDICAL CARE IF:  There is cloudy discharge at the needle insertion site.  The skin around the needle insertion site is red and the redness is spreading.  The pain or swelling around the needle insertion site gets worse. SEEK IMMEDIATE MEDICAL CARE IF:  You develop chest pain or tightness.  You develop a rapid heart  rate.  You have shortness of breath.  You have a tired, weak feeling all over your body (fatigue).  Your skin turns a bluish color.  You have a fever. Document Released: 06/01/2013 Document Reviewed: 06/01/2013 [facility] Patient Information 2015 [place] [place]. This information is not intended to replace advice given to you by your health care provider. Make sure you discuss any questions you have with your health care provider.

## 2014-12-06 ENCOUNTER — Other Ambulatory Visit: Payer: Self-pay

## 2014-12-06 DIAGNOSIS — Z1231 Encounter for screening mammogram for malignant neoplasm of breast: Secondary | ICD-10-CM

## 2015-01-11 ENCOUNTER — Ambulatory Visit
Admission: RE | Admit: 2015-01-11 | Discharge: 2015-01-11 | Disposition: A | Discharge status: Home / Self Care | Payer: BLUE CROSS/BLUE SHIELD | Source: Ambulatory Visit

## 2015-01-11 DIAGNOSIS — Z1231 Encounter for screening mammogram for malignant neoplasm of breast: Secondary | ICD-10-CM

## 2015-01-12 ENCOUNTER — Ambulatory Visit (INDEPENDENT_AMBULATORY_CARE_PROVIDER_SITE_OTHER): Payer: BLUE CROSS/BLUE SHIELD | Admitting: Gynecology

## 2015-01-12 ENCOUNTER — Encounter: Payer: Self-pay | Admitting: Gynecology

## 2015-01-12 ENCOUNTER — Other Ambulatory Visit: Payer: Self-pay | Admitting: Family Medicine

## 2015-01-12 VITALS — BP 116/74 | Ht 64.0 in | Wt 139.0 lb

## 2015-01-12 DIAGNOSIS — Z01419 Encounter for gynecological examination (general) (routine) without abnormal findings: Secondary | ICD-10-CM

## 2015-01-12 DIAGNOSIS — R928 Other abnormal and inconclusive findings on diagnostic imaging of breast: Secondary | ICD-10-CM

## 2015-01-12 MED ORDER — ALPRAZOLAM 0.5 MG PO TABS: 0.5000 mg | ORAL_TABLET | Freq: Two times a day (BID) | ORAL | Status: AC | PRN

## 2015-01-12 NOTE — Patient Instructions (Signed)
You may obtain a copy of any labs that were done today by logging onto MyChart as outlined in the instructions provided with your AVS (after visit summary). The office will not call with normal lab results but certainly if there are any significant abnormalities then we will contact you.   Health Maintenance, Female A healthy lifestyle and preventative care can promote health and wellness.  Maintain regular health, dental, and eye exams.  Eat a healthy diet. Foods like vegetables, fruits, whole grains, low-fat dairy products, and lean protein foods contain the nutrients you need without too many calories. Decrease your intake of foods high in solid fats, added sugars, and salt. Get information about a proper diet from your caregiver, if necessary.  Regular physical exercise is one of the most important things you can do for your health. Most adults should get at least 150 minutes of moderate-intensity exercise (any activity that increases your heart rate and causes you to sweat) each week. In addition, most adults need muscle-strengthening exercises on 2 or more days a week.   Maintain a healthy weight. The body mass index (BMI) is a screening tool to identify possible weight problems. It provides an estimate of body fat based on height and weight. Your caregiver can help determine your BMI, and can help you achieve or maintain a healthy weight. For adults 20 years and older:  A BMI below 18.5 is considered underweight.  A BMI of 18.5 to 24.9 is normal.  A BMI of 25 to 29.9 is considered overweight.  A BMI of 30 and above is considered obese.  Maintain normal blood lipids and cholesterol by exercising and minimizing your intake of saturated fat. Eat a balanced diet with plenty of fruits and vegetables. Blood tests for lipids and cholesterol should begin at age 61 and be repeated every 5 years. If your lipid or cholesterol levels are high, you are over 50, or you are a high risk for heart  disease, you may need your cholesterol levels checked more frequently.Ongoing high lipid and cholesterol levels should be treated with medicines if diet and exercise are not effective.  If you smoke, find out from your caregiver how to quit. If you do not use tobacco, do not start.  Lung cancer screening is recommended for adults aged 33 80 years who are at high risk for developing lung cancer because of a history of smoking. Yearly low-dose computed tomography (CT) is recommended for people who have at least a 30-pack-year history of smoking and are a current smoker or have quit within the past 15 years. A pack year of smoking is smoking an average of 1 pack of cigarettes a day for 1 year (for example: 1 pack a day for 30 years or 2 packs a day for 15 years). Yearly screening should continue until the smoker has stopped smoking for at least 15 years. Yearly screening should also be stopped for people who develop a health problem that would prevent them from having lung cancer treatment.  If you are pregnant, do not drink alcohol. If you are breastfeeding, be very cautious about drinking alcohol. If you are not pregnant and choose to drink alcohol, do not exceed 1 drink per day. One drink is considered to be 12 ounces (355 mL) of beer, 5 ounces (148 mL) of wine, or 1.5 ounces (44 mL) of liquor.  Avoid use of street drugs. Do not share needles with anyone. Ask for help if you need support or instructions about stopping  the use of drugs.  High blood pressure causes heart disease and increases the risk of stroke. Blood pressure should be checked at least every 1 to 2 years. Ongoing high blood pressure should be treated with medicines, if weight loss and exercise are not effective.  If you are 59 to 48 years old, ask your caregiver if you should take aspirin to prevent strokes.  Diabetes screening involves taking a blood sample to check your fasting blood sugar level. This should be done once every 3  years, after age 91, if you are within normal weight and without risk factors for diabetes. Testing should be considered at a younger age or be carried out more frequently if you are overweight and have at least 1 risk factor for diabetes.  Breast cancer screening is essential preventative care for women. You should practice "breast self-awareness." This means understanding the normal appearance and feel of your breasts and may include breast self-examination. Any changes detected, no matter how small, should be reported to a caregiver. Women in their 66s and 30s should have a clinical breast exam (CBE) by a caregiver as part of a regular health exam every 1 to 3 years. After age 101, women should have a CBE every year. Starting at age 100, women should consider having a mammogram (breast X-ray) every year. Women who have a family history of breast cancer should talk to their caregiver about genetic screening. Women at a high risk of breast cancer should talk to their caregiver about having an MRI and a mammogram every year.  Breast cancer gene (BRCA)-related cancer risk assessment is recommended for women who have family members with BRCA-related cancers. BRCA-related cancers include breast, ovarian, tubal, and peritoneal cancers. Having family members with these cancers may be associated with an increased risk for harmful changes (mutations) in the breast cancer genes BRCA1 and BRCA2. Results of the assessment will determine the need for genetic counseling and BRCA1 and BRCA2 testing.  The Pap test is a screening test for cervical cancer. Women should have a Pap test starting at age 57. Between ages 25 and 35, Pap tests should be repeated every 2 years. Beginning at age 37, you should have a Pap test every 3 years as long as the past 3 Pap tests have been normal. If you had a hysterectomy for a problem that was not cancer or a condition that could lead to cancer, then you no longer need Pap tests. If you are  between ages 50 and 76, and you have had normal Pap tests going back 10 years, you no longer need Pap tests. If you have had past treatment for cervical cancer or a condition that could lead to cancer, you need Pap tests and screening for cancer for at least 20 years after your treatment. If Pap tests have been discontinued, risk factors (such as a new sexual partner) need to be reassessed to determine if screening should be resumed. Some women have medical problems that increase the chance of getting cervical cancer. In these cases, your caregiver may recommend more frequent screening and Pap tests.  The human papillomavirus (HPV) test is an additional test that may be used for cervical cancer screening. The HPV test looks for the virus that can cause the cell changes on the cervix. The cells collected during the Pap test can be tested for HPV. The HPV test could be used to screen women aged 44 years and older, and should be used in women of any age  who have unclear Pap test results. After the age of 55, women should have HPV testing at the same frequency as a Pap test.  Colorectal cancer can be detected and often prevented. Most routine colorectal cancer screening begins at the age of 44 and continues through age 20. However, your caregiver may recommend screening at an earlier age if you have risk factors for colon cancer. On a yearly basis, your caregiver may provide home test kits to check for hidden blood in the stool. Use of a small camera at the end of a tube, to directly examine the colon (sigmoidoscopy or colonoscopy), can detect the earliest forms of colorectal cancer. Talk to your caregiver about this at age 86, when routine screening begins. Direct examination of the colon should be repeated every 5 to 10 years through age 13, unless early forms of pre-cancerous polyps or small growths are found.  Hepatitis C blood testing is recommended for all people born from [birth year] through 1964 and any  individual with known risks for hepatitis C.  Practice safe sex. Use condoms and avoid high-risk sexual practices to reduce the spread of sexually transmitted infections (STIs). Sexually active women aged 36 and younger should be checked for Chlamydia, which is a common sexually transmitted infection. Older women with new or multiple partners should also be tested for Chlamydia. Testing for other STIs is recommended if you are sexually active and at increased risk.  Osteoporosis is a disease in which the bones lose minerals and strength with aging. This can result in serious bone fractures. The risk of osteoporosis can be identified using a bone density scan. Women ages 20 and over and women at risk for fractures or osteoporosis should discuss screening with their caregivers. Ask your caregiver whether you should be taking a calcium supplement or vitamin D to reduce the rate of osteoporosis.  Menopause can be associated with physical symptoms and risks. Hormone replacement therapy is available to decrease symptoms and risks. You should talk to your caregiver about whether hormone replacement therapy is right for you.  Use sunscreen. Apply sunscreen liberally and repeatedly throughout the day. You should seek shade when your shadow is shorter than you. Protect yourself by wearing long sleeves, pants, a wide-brimmed hat, and sunglasses year round, whenever you are outdoors.  Notify your caregiver of new moles or changes in moles, especially if there is a change in shape or color. Also notify your caregiver if a mole is larger than the size of a pencil eraser.  Stay current with your immunizations. Document Released: 12/10/2010 Document Revised: 09/21/2012 Document Reviewed: 12/10/2010 [facility] Patient Information 2014 [employer].

## 2015-01-12 NOTE — Progress Notes (Signed)
Taylor Woodard 09-11-1965 [id number]        49 y.o.  [id number] for annual exam. Doing well.  Past medical history,surgical history, problem list, medications, allergies, family history and social history were all reviewed and documented as reviewed in the EPIC chart.  ROS:  Performed with pertinent positives and negatives included in the history, assessment and plan.   Additional significant findings :  none   Exam: [name] person Vitals:   01/12/15 1203  BP: 116/74  Height:  (1.626 m)  Weight: 139 lb (63.05 kg)   General appearance:  Normal affect, orientation and appearance. Skin: Grossly normal HEENT: Without gross lesions.  No cervical or supraclavicular adenopathy. Thyroid normal.  Lungs:  Clear without wheezing, rales or rhonchi Cardiac: RR, without RMG Abdominal:  Soft, nontender, without masses, guarding, rebound, organomegaly or hernia Breasts:  Examined lying and sitting without masses, retractions, discharge or axillary adenopathy. Pelvic:  Ext/BUS/vagina normal  Adnexa  Without masses or tenderness    Anus and perineum  Normal   Rectovaginal  Normal sphincter tone without palpated masses or tenderness.    Assessment/Plan:  49 y.o. [id number] female for annual exam.   1. History of TVH for menorrhagia and prolapse. Doing well without symptoms to suggest menopause. Continue to monitor. 2. Pap smear 2015. No Pap smear done today. No history of significant abnormal Pap smears. Options to stop screening altogether she is status post hysterectomy for benign indications versus less frequent screening intervals reviewed. Will readdress on an annual basis. 3. Mammography yesterday. Continue with annual mammography. SBE monthly reviewed. 4. Colon cancer in her mother in the mid 8s. We'll start screening at age 72. 5. Anxiety. Patients on fluoxetine 20 mg daily prescribed by her primary physician Dr. Kevan Ny. She did ask for supplemental anxiolytic as her son is leaving  for college and she anticipates a lot of emotional swings. Xanax 0.5 mg #30 with 1 refill provided. 6. Health maintenance. No routine blood work done as this is done at her primary physician's office. Follow up in one year, sooner as needed.   Dara Lords MD, 12:35 PM 01/12/2015

## 2015-01-17 ENCOUNTER — Ambulatory Visit
Admission: RE | Admit: 2015-01-17 | Discharge: 2015-01-17 | Disposition: A | Discharge status: Home / Self Care | Payer: BLUE CROSS/BLUE SHIELD | Source: Ambulatory Visit | Attending: Family Medicine | Admitting: Family Medicine

## 2015-01-17 DIAGNOSIS — R928 Other abnormal and inconclusive findings on diagnostic imaging of breast: Secondary | ICD-10-CM

## 2016-01-01 ENCOUNTER — Other Ambulatory Visit: Payer: Self-pay | Admitting: Family Medicine

## 2016-01-01 DIAGNOSIS — Z1231 Encounter for screening mammogram for malignant neoplasm of breast: Secondary | ICD-10-CM

## 2016-01-15 ENCOUNTER — Ambulatory Visit (INDEPENDENT_AMBULATORY_CARE_PROVIDER_SITE_OTHER): Payer: BLUE CROSS/BLUE SHIELD | Admitting: Gynecology

## 2016-01-15 ENCOUNTER — Ambulatory Visit
Admission: RE | Admit: 2016-01-15 | Discharge: 2016-01-15 | Disposition: A | Discharge status: Home / Self Care | Payer: BLUE CROSS/BLUE SHIELD | Source: Ambulatory Visit | Attending: Family Medicine | Admitting: Family Medicine

## 2016-01-15 ENCOUNTER — Encounter: Payer: Self-pay | Admitting: Gynecology

## 2016-01-15 VITALS — BP 116/76 | Ht 63.0 in | Wt 144.0 lb

## 2016-01-15 DIAGNOSIS — Z01419 Encounter for gynecological examination (general) (routine) without abnormal findings: Secondary | ICD-10-CM

## 2016-01-15 DIAGNOSIS — Z1231 Encounter for screening mammogram for malignant neoplasm of breast: Secondary | ICD-10-CM

## 2016-01-15 NOTE — Progress Notes (Signed)
    Taylor Woodard [birth year] [id number]        50 y.o.  [id number]  for annual exam.  Doing well without complaints.  Past medical history,surgical history, problem list, medications, allergies, family history and social history were all reviewed and documented as reviewed in the EPIC chart.  ROS:  Performed with pertinent positives and negatives included in the history, assessment and plan.   Additional significant findings :  None   Exam: Kennon PortelaKim Gardner assistant Vitals:   01/15/16 1152  BP: 116/76  Weight: 144 lb (65.3 kg)  Height: 5\' 3"  (1.6 m)   Body mass index is 25.51 kg/m.  General appearance:  Normal affect, orientation and appearance. Skin: Grossly normal HEENT: Without gross lesions.  No cervical or supraclavicular adenopathy. Thyroid normal.  Lungs:  Clear without wheezing, rales or rhonchi Cardiac: RR, without RMG Abdominal:  Soft, nontender, without masses, guarding, rebound, organomegaly or hernia Breasts:  Examined lying and sitting without masses, retractions, discharge or axillary adenopathy. Pelvic:  Ext/BUS/Vagina normal  Adnexa without masses or tenderness    Anus and perineum normal   Rectovaginal normal sphincter tone without palpated masses or tenderness.    Assessment/Plan:  50 y.o. [id number] female for annual exam.   1. History of TVH for menorrhagia and prolapse. Doing well without significant hot flushes, night sweats or vaginal dryness. Continue to monitor report any issues. 2. Pap smear 2015. No Pap smear done today. No history of abnormal Pap smears. Options to stop screening versus less frequent screening intervals reviewed per current screening guidelines. Will readdress on an annual basis. 3. History of colon cancer in her mother mid 4960s. Will plan at age 50 for screening colonoscopy. 4. Mammography today. SBE monthly reviewed. 5. Health maintenance. No routine lab work done as this is done at her primary physician's office. Follow up 1 year,  sooner as needed.   Dara LordsFONTAINE,TIMOTHY P MD, 12:14 PM 01/15/2016

## 2016-01-15 NOTE — Patient Instructions (Signed)

## 2016-02-23 ENCOUNTER — Encounter: Payer: BLUE CROSS/BLUE SHIELD | Admitting: Gynecology

## 2016-03-04 ENCOUNTER — Encounter: Payer: BLUE CROSS/BLUE SHIELD | Admitting: Gynecology

## 2016-12-02 ENCOUNTER — Other Ambulatory Visit: Payer: Self-pay | Admitting: Family Medicine

## 2016-12-02 DIAGNOSIS — Z1231 Encounter for screening mammogram for malignant neoplasm of breast: Secondary | ICD-10-CM

## 2017-01-15 ENCOUNTER — Encounter: Payer: BLUE CROSS/BLUE SHIELD | Admitting: Gynecology

## 2017-01-21 ENCOUNTER — Ambulatory Visit: Payer: BLUE CROSS/BLUE SHIELD

## 2017-01-22 ENCOUNTER — Ambulatory Visit: Payer: BLUE CROSS/BLUE SHIELD

## 2017-01-23 ENCOUNTER — Encounter: Payer: Self-pay | Admitting: Gynecology

## 2017-01-23 ENCOUNTER — Ambulatory Visit (INDEPENDENT_AMBULATORY_CARE_PROVIDER_SITE_OTHER): Payer: 59 | Admitting: Gynecology

## 2017-01-23 VITALS — BP 120/78 | Ht 64.0 in | Wt 146.0 lb

## 2017-01-23 DIAGNOSIS — Z01419 Encounter for gynecological examination (general) (routine) without abnormal findings: Secondary | ICD-10-CM

## 2017-01-23 NOTE — Patient Instructions (Signed)
Followup in one year for annual exam, sooner if any issues 

## 2017-01-23 NOTE — Progress Notes (Signed)
    Delaine LameCatherine Mraz [birth year] [id number]        51 y.o.  [id number] for annual exam.    Past medical history,surgical history, problem list, medications, allergies, family history and social history were all reviewed and documented as reviewed in the EPIC chart.  ROS:  Performed with pertinent positives and negatives included in the history, assessment and plan.   Additional significant findings :  None   Exam: Kennon PortelaKim Gardner assistant Vitals:   01/23/17 1229  BP: 120/78  Weight: 146 lb (66.2 kg)  Height: 5\' 4"  (1.626 m)   Body mass index is 25.06 kg/m.  General appearance:  Normal affect, orientation and appearance. Skin: Grossly normal HEENT: Without gross lesions.  No cervical or supraclavicular adenopathy. Thyroid normal.  Lungs:  Clear without wheezing, rales or rhonchi Cardiac: RR, without RMG Abdominal:  Soft, nontender, without masses, guarding, rebound, organomegaly or hernia Breasts:  Examined lying and sitting without masses, retractions, discharge or axillary adenopathy. Pelvic:  Ext, BUS, Vagina: Normal  Adnexa: Without masses or tenderness    Anus and perineum: Normal   Rectovaginal: Normal sphincter tone without palpated masses or tenderness.    Assessment/Plan:  51 y.o. [id number] female for annual exam status post TVH for menorrhagia and prolapse..   1. Pap smear 2015. Pap smear done today. No history of abnormal Pap smears. Options to stop screening per current screening guidelines based on hysterectomy history reviewed. Will readdress on annual basis. 2. History of colon cancer in her mother in the mid 3860s. Patient has colonoscopy scheduled 02/2017. 3. Mammography scheduled next week and patient will follow up for this. Breast exam normal today. 4. Health maintenance. No routine lab work done as patient does this elsewhere. Follow up 1 year, sooner as needed.   Dara LordsFONTAINE,TIMOTHY P MD, 12:57 PM 01/23/2017

## 2017-01-23 NOTE — Addendum Note (Signed)
Addended by: Dayna BarkerGARDNER, Williette Loewe K on: 01/23/2017 01:01 PM   Modules accepted: Orders

## 2017-01-27 LAB — PAP IG W/ RFLX HPV ASCU

## 2017-01-29 ENCOUNTER — Ambulatory Visit
Admission: RE | Admit: 2017-01-29 | Discharge: 2017-01-29 | Disposition: A | Discharge status: Home / Self Care | Payer: 59 | Source: Ambulatory Visit | Attending: Family Medicine | Admitting: Family Medicine

## 2017-01-29 DIAGNOSIS — Z1231 Encounter for screening mammogram for malignant neoplasm of breast: Secondary | ICD-10-CM

## 2018-01-14 ENCOUNTER — Other Ambulatory Visit: Payer: Self-pay | Admitting: Family Medicine

## 2018-01-14 DIAGNOSIS — Z1231 Encounter for screening mammogram for malignant neoplasm of breast: Secondary | ICD-10-CM

## 2018-02-13 ENCOUNTER — Encounter: Payer: Self-pay | Admitting: Gynecology

## 2018-02-13 ENCOUNTER — Ambulatory Visit (INDEPENDENT_AMBULATORY_CARE_PROVIDER_SITE_OTHER): Payer: Managed Care, Other (non HMO) | Admitting: Gynecology

## 2018-02-13 ENCOUNTER — Ambulatory Visit
Admission: RE | Admit: 2018-02-13 | Discharge: 2018-02-13 | Disposition: A | Discharge status: Home / Self Care | Payer: Managed Care, Other (non HMO) | Source: Ambulatory Visit | Attending: Family Medicine | Admitting: Family Medicine

## 2018-02-13 VITALS — BP 118/76 | Ht 63.0 in | Wt 147.0 lb

## 2018-02-13 DIAGNOSIS — Z01419 Encounter for gynecological examination (general) (routine) without abnormal findings: Secondary | ICD-10-CM | POA: Diagnosis not present

## 2018-02-13 DIAGNOSIS — Z1231 Encounter for screening mammogram for malignant neoplasm of breast: Secondary | ICD-10-CM

## 2018-02-13 NOTE — Progress Notes (Signed)
    Taylor Woodard 07/04/1965 [id number]        52 y.o.  [id number] for annual gynecologic exam.  Doing well without gynecologic complaints  Past medical history,surgical history, problem list, medications, allergies, family history and social history were all reviewed and documented as reviewed in the EPIC chart.  ROS:  Performed with pertinent positives and negatives included in the history, assessment and plan.   Additional significant findings : None   Exam: Taylor Woodard assistant Vitals:   02/13/18 0955  BP: 118/76  Weight: 147 lb (66.7 kg)  Height: 5\' 3"  (1.6 m)   Body mass index is 26.04 kg/m.  General appearance:  Normal affect, orientation and appearance. Skin: Grossly normal HEENT: Without gross lesions.  No cervical or supraclavicular adenopathy. Thyroid normal.  Lungs:  Clear without wheezing, rales or rhonchi Cardiac: RR, without RMG Abdominal:  Soft, nontender, without masses, guarding, rebound, organomegaly or hernia Breasts:  Examined lying and sitting without masses, retractions, discharge or axillary adenopathy. Pelvic:  Ext, BUS, Vagina: Normal  Adnexa: Without masses or tenderness    Anus and perineum: Normal   Rectovaginal: Normal sphincter tone without palpated masses or tenderness.    Assessment/Plan:  52 y.o. [name] female for annual gynecologic exam status post TVH for menorrhagia and prolapse.   1. Perimenopausal.  No significant menopausal symptoms.  Continue to monitor and report any significant symptoms. 2. Mammography today.  Breast exam normal today. 3. Pap smear 2018.  No Pap smear done today.  No history of significant abnormal Pap smears.  Options to stop screening per current screening guidelines based on hysterectomy reviewed.  Will readdress on an annual basis. 4. Colonoscopy 2018.  Family history of colon cancer in her mother mid 34s.  Repeat at gastroenterologist's recommended interval. 5. Health maintenance.  No routine lab work done  as patient reports this done elsewhere.  Follow-up 1 year, sooner as needed.   Taylor Woodard, 10:46 AM 02/13/2018

## 2018-02-13 NOTE — Patient Instructions (Signed)
Follow-up in 1 year, sooner as needed. 

## 2019-02-03 ENCOUNTER — Other Ambulatory Visit: Payer: Self-pay | Admitting: Gynecology

## 2019-02-03 DIAGNOSIS — Z1231 Encounter for screening mammogram for malignant neoplasm of breast: Secondary | ICD-10-CM

## 2019-02-17 ENCOUNTER — Other Ambulatory Visit: Payer: Self-pay

## 2019-02-18 ENCOUNTER — Ambulatory Visit (INDEPENDENT_AMBULATORY_CARE_PROVIDER_SITE_OTHER): Payer: Managed Care, Other (non HMO) | Admitting: Gynecology

## 2019-02-18 ENCOUNTER — Encounter: Payer: Self-pay | Admitting: Gynecology

## 2019-02-18 VITALS — BP 130/82 | Ht 63.0 in | Wt 136.0 lb

## 2019-02-18 DIAGNOSIS — N952 Postmenopausal atrophic vaginitis: Secondary | ICD-10-CM | POA: Diagnosis not present

## 2019-02-18 DIAGNOSIS — Z01419 Encounter for gynecological examination (general) (routine) without abnormal findings: Secondary | ICD-10-CM

## 2019-02-18 NOTE — Patient Instructions (Signed)
Follow-up in 1 year for annual exam, sooner as needed. 

## 2019-02-18 NOTE — Progress Notes (Signed)
    Taylor Woodard 02/08/1966 [id number]        53 y.o.  [id number] for annual gynecologic exam.  Without gynecologic complaints  Past medical history,surgical history, problem list, medications, allergies, family history and social history were all reviewed and documented as reviewed in the EPIC chart.  ROS:  Performed with pertinent positives and negatives included in the history, assessment and plan.   Additional significant findings : None   Exam: [employer] Vitals:   02/18/19 1412  BP: 130/82  Weight: 136 lb (61.7 kg)  Height: 5\' 3"  (1.6 m)   Body mass index is 24.09 kg/m.  General appearance:  Normal affect, orientation and appearance. Skin: Grossly normal HEENT: Without gross lesions.  No cervical or supraclavicular adenopathy. Thyroid normal.  Lungs:  Clear without wheezing, rales or rhonchi Cardiac: RR, without RMG Abdominal:  Soft, nontender, without masses, guarding, rebound, organomegaly or hernia Breasts:  Examined lying and sitting without masses, retractions, discharge or axillary adenopathy. Pelvic:  Ext, BUS, Vagina: Normal with mild atrophic changes  Adnexa: Without masses or tenderness    Anus and perineum: Normal   Rectovaginal: Normal sphincter tone without palpated masses or tenderness.    Assessment/Plan:  53 y.o. [id number] female for annual gynecologic exam.  Status post TVH for menorrhagia and prolapse  1. Postmenopausal.  No significant menopausal symptoms. 2. Mammography coming due in the patient is in the process of arranging.  Breast exam normal today. 3. Pap smear 2018.  No Pap smear done today.  No history of abnormal Pap smears.  Options to stop screening per current screening guidelines based on hysterectomy history reviewed versus less frequent screening intervals.  Will readdress on an annual basis. 4. Colonoscopy 2018.  Repeat at their recommended interval.  Mother with colon cancer in her 31s. 5. Health maintenance.  No routine lab  work done as patient does this elsewhere.  Follow-up 1 year, sooner as needed.   Anastasio Auerbach MD, 2:34 PM 02/18/2019

## 2019-03-03 ENCOUNTER — Encounter: Payer: Self-pay | Admitting: Gynecology

## 2019-03-18 ENCOUNTER — Other Ambulatory Visit: Payer: Self-pay

## 2019-03-18 ENCOUNTER — Ambulatory Visit
Admission: RE | Admit: 2019-03-18 | Discharge: 2019-03-18 | Disposition: A | Discharge status: Home / Self Care | Payer: Managed Care, Other (non HMO) | Source: Ambulatory Visit | Attending: Gynecology | Admitting: Gynecology

## 2019-03-18 DIAGNOSIS — Z1231 Encounter for screening mammogram for malignant neoplasm of breast: Secondary | ICD-10-CM

## 2019-06-18 ENCOUNTER — Ambulatory Visit: Payer: Managed Care, Other (non HMO) | Attending: Internal Medicine

## 2019-06-18 DIAGNOSIS — Z20822 Contact with and (suspected) exposure to covid-19: Secondary | ICD-10-CM

## 2019-06-20 ENCOUNTER — Telehealth: Payer: Self-pay | Admitting: Adult Health

## 2019-06-20 LAB — NOVEL CORONAVIRUS, NAA: SARS-CoV-2, NAA: DETECTED — AB

## 2019-06-20 NOTE — Telephone Encounter (Signed)
Patient called about Positive Covid test.  2 patient identifiers confirmed.  Date Tested: 06/18/2019  Date of Symptom onset: 06/16/2019   Symptoms: Mild    Isolation Recommendations:  Patient understands the needs to stay in isolation for a total of 10 days from onset of symptom or 14 days total from date of testing if no symptom. Reviewed Masking.    Supportive Care Recommendations: Encouraged plenty of fluid intake, Tylenol per package directions, and to remain as active as possible.    Patient knows the health department may be in touch.    I answered all of patient's questions and all concerns addressed.  Gave information on My chart.    Time Spent: 4 minutes  Lillard Anes, NP

## 2019-06-22 ENCOUNTER — Other Ambulatory Visit: Payer: Managed Care, Other (non HMO)

## 2020-05-08 ENCOUNTER — Other Ambulatory Visit: Payer: Self-pay | Admitting: Family Medicine

## 2020-05-08 DIAGNOSIS — Z1231 Encounter for screening mammogram for malignant neoplasm of breast: Secondary | ICD-10-CM

## 2020-05-10 ENCOUNTER — Other Ambulatory Visit: Payer: Self-pay

## 2020-05-10 ENCOUNTER — Ambulatory Visit
Admission: RE | Admit: 2020-05-10 | Discharge: 2020-05-10 | Disposition: A | Discharge status: Home / Self Care | Payer: Managed Care, Other (non HMO) | Source: Ambulatory Visit | Attending: Family Medicine | Admitting: Family Medicine

## 2020-05-10 DIAGNOSIS — Z1231 Encounter for screening mammogram for malignant neoplasm of breast: Secondary | ICD-10-CM

## 2021-04-20 ENCOUNTER — Other Ambulatory Visit: Payer: Self-pay | Admitting: Family Medicine

## 2021-04-20 DIAGNOSIS — Z1231 Encounter for screening mammogram for malignant neoplasm of breast: Secondary | ICD-10-CM

## 2021-05-23 ENCOUNTER — Ambulatory Visit
Admission: RE | Admit: 2021-05-23 | Discharge: 2021-05-23 | Disposition: A | Discharge status: Home / Self Care | Payer: Managed Care, Other (non HMO) | Source: Ambulatory Visit | Attending: Family Medicine | Admitting: Family Medicine

## 2021-05-23 DIAGNOSIS — Z1231 Encounter for screening mammogram for malignant neoplasm of breast: Secondary | ICD-10-CM

## 2021-05-24 ENCOUNTER — Other Ambulatory Visit: Payer: Self-pay | Admitting: Family Medicine

## 2021-05-24 DIAGNOSIS — R928 Other abnormal and inconclusive findings on diagnostic imaging of breast: Secondary | ICD-10-CM

## 2021-07-05 ENCOUNTER — Ambulatory Visit
Admission: RE | Admit: 2021-07-05 | Discharge: 2021-07-05 | Disposition: A | Discharge status: Home / Self Care | Payer: Managed Care, Other (non HMO) | Source: Ambulatory Visit | Attending: Family Medicine | Admitting: Family Medicine

## 2021-07-05 ENCOUNTER — Ambulatory Visit: Payer: Managed Care, Other (non HMO)

## 2021-07-05 ENCOUNTER — Other Ambulatory Visit: Payer: Self-pay | Admitting: Family Medicine

## 2021-07-05 DIAGNOSIS — R921 Mammographic calcification found on diagnostic imaging of breast: Secondary | ICD-10-CM

## 2021-07-05 DIAGNOSIS — R928 Other abnormal and inconclusive findings on diagnostic imaging of breast: Secondary | ICD-10-CM

## 2021-07-23 ENCOUNTER — Ambulatory Visit
Admission: RE | Admit: 2021-07-23 | Discharge: 2021-07-23 | Disposition: A | Discharge status: Home / Self Care | Payer: Managed Care, Other (non HMO) | Source: Ambulatory Visit | Attending: Family Medicine | Admitting: Family Medicine

## 2021-07-23 DIAGNOSIS — R921 Mammographic calcification found on diagnostic imaging of breast: Secondary | ICD-10-CM

## 2022-03-16 IMAGING — MG MM BREAST BX W LOC DEV 1ST LESION IMAGE BX SPEC STEREO GUIDE*L*
8 of 17 series · 8 of 33 positions shown · non-contrast
Comparison: Previous exams.
COMPARISON: Previous exams.

Addendum:
CLINICAL DATA: Patient with indeterminate left breast
calcifications.

EXAM:
LEFT BREAST STEREOTACTIC CORE NEEDLE BIOPSY

[L (1 of 8)]
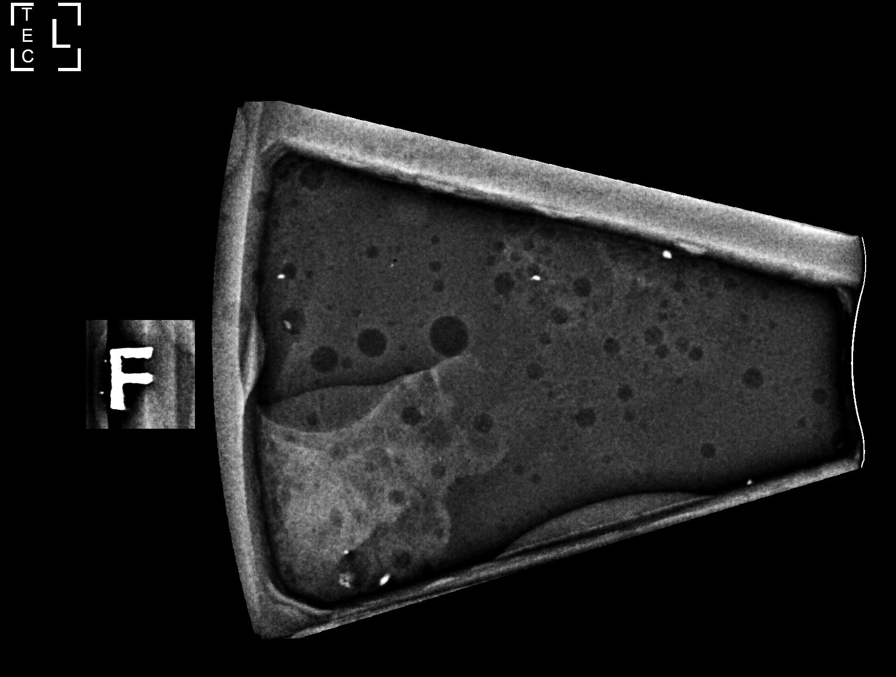

[L (2 of 8)]
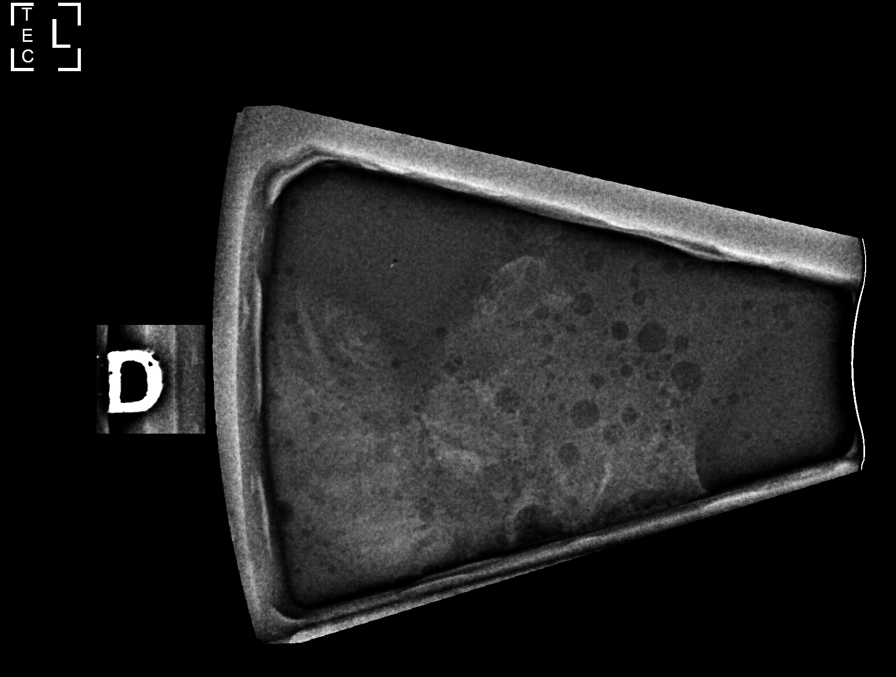

[L (3 of 8)]
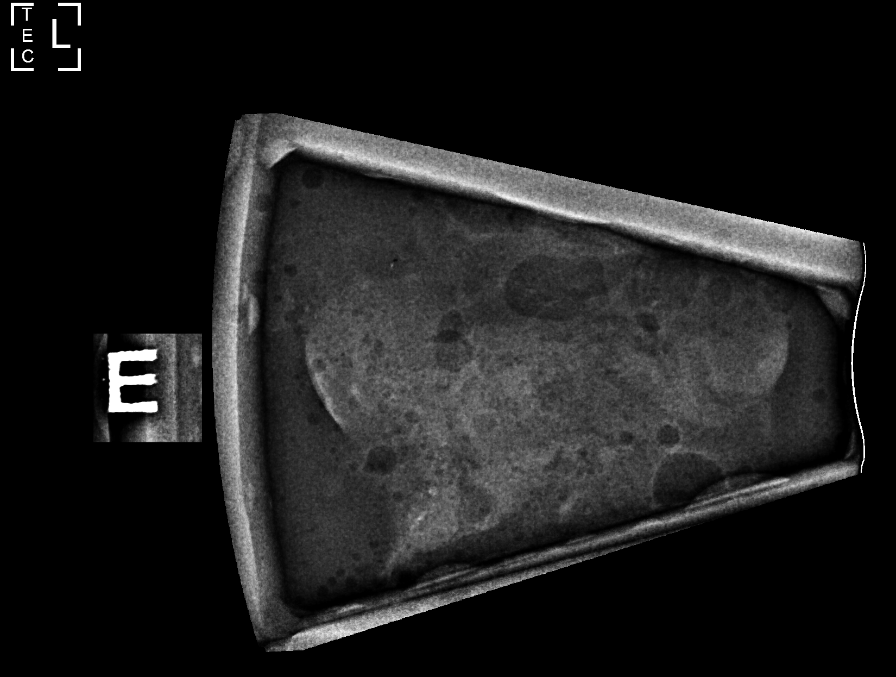

[L (4 of 8)]
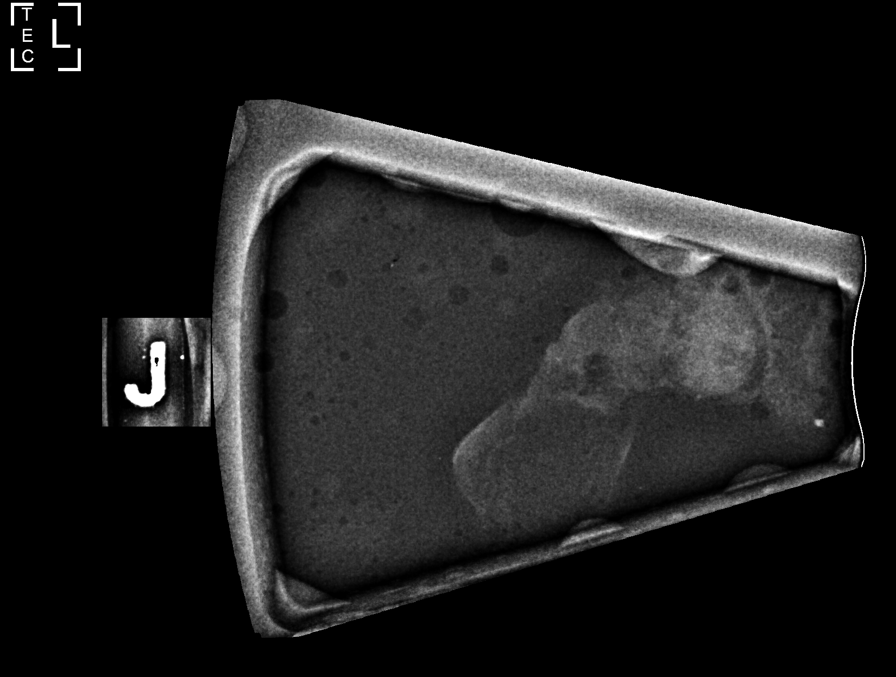

[L (5 of 8)]
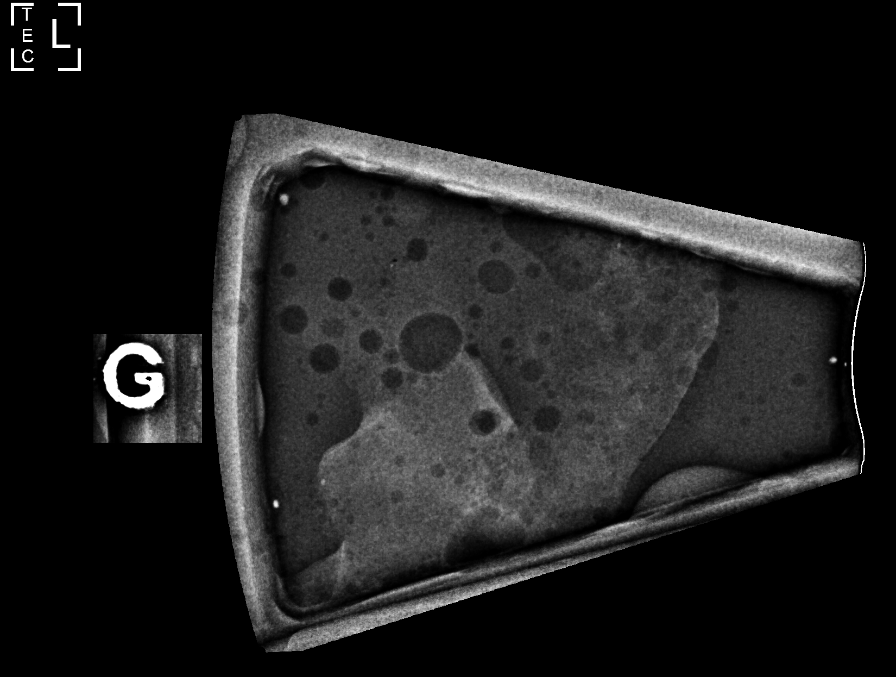

[L (6 of 8)]
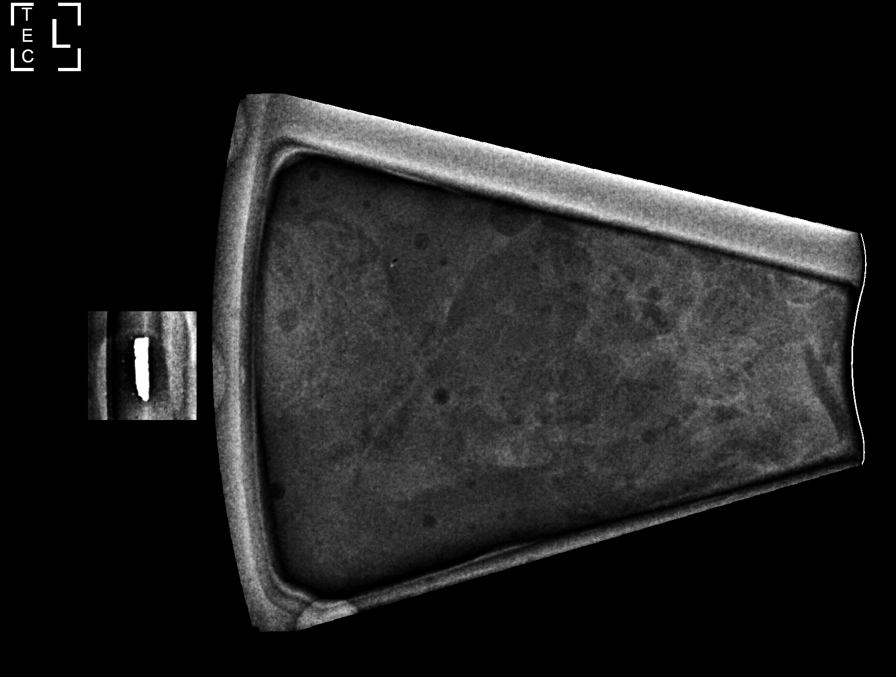

[L (7 of 8)]
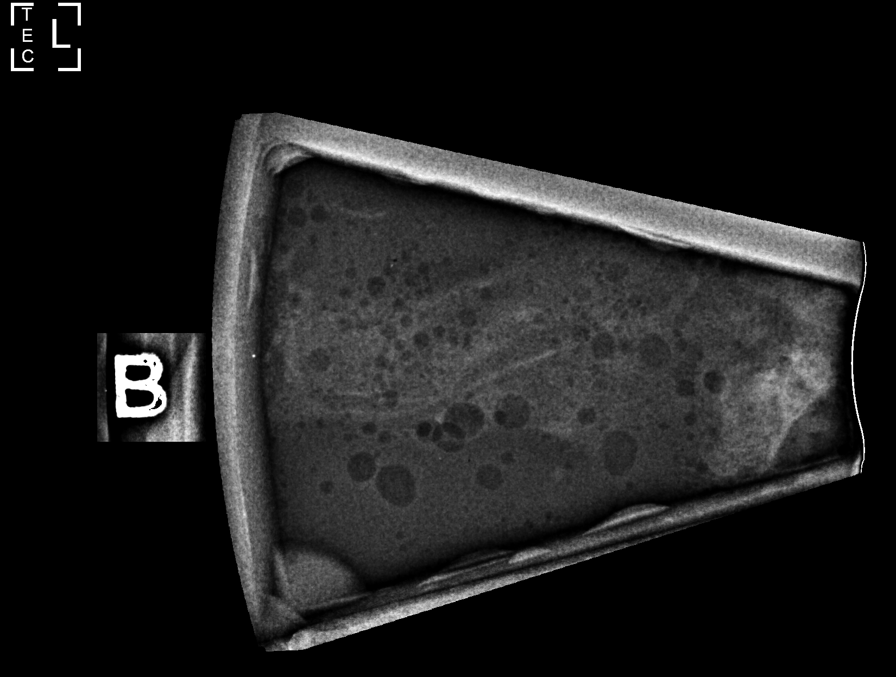

[L (8 of 8)]
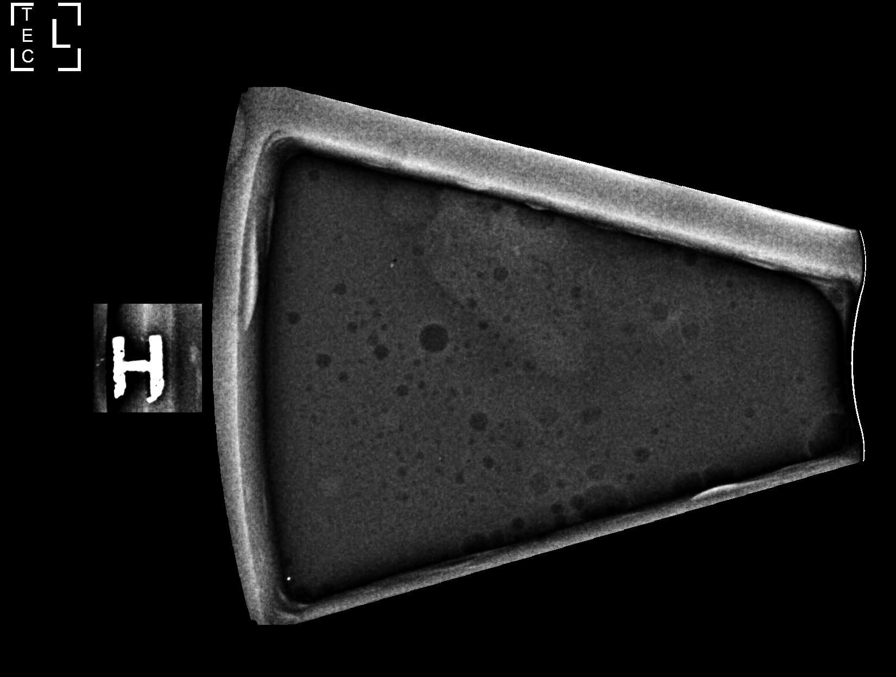

[8 of 33 positions shown; findings below may reference images not displayed]



Using sterile technique and 1% Lidocaine as local anesthetic, under
stereotactic guidance, a 9 gauge vacuum assisted device was used to
perform core needle biopsy of calcifications within the upper-outer
left breast using a lateral approach. Specimen radiograph was
performed showing calcifications. Specimens with calcifications are
identified for pathology.

Lesion quadrant: Upper outer quadrant

At the conclusion of the procedure, coil shaped tissue marker clip
was deployed into the biopsy cavity. Follow-up 2-view mammogram was
performed and dictated separately.
IMPRESSION: Stereotactic-guided biopsy of left breast calcifications. No
apparent complications.

ADDENDUM:
Pathology revealed FIBROCYSTIC CHANGE WITH ADENOSIS AND
CALCIFICATIONS of the LEFT breast, outer, (coil clip). This was
found to be concordant by Dr. Owuso Abuelo.

Pathology results were discussed with the patient by telephone. The
patient reported doing well after the biopsy with tenderness at the
site. Post biopsy instructions and care were reviewed and questions
were answered. The patient was encouraged to call The [REDACTED] for any additional concerns. My direct phone
number was provided.

The patient was instructed to return for annual screening
mammography in May 2022.

Pathology results reported by Sakine Nibbering, RN on 07/24/2021.



Using sterile technique and 1% Lidocaine as local anesthetic, under
stereotactic guidance, a 9 gauge vacuum assisted device was used to
perform core needle biopsy of calcifications within the upper-outer
left breast using a lateral approach. Specimen radiograph was
performed showing calcifications. Specimens with calcifications are
identified for pathology.

Lesion quadrant: Upper outer quadrant

At the conclusion of the procedure, coil shaped tissue marker clip
was deployed into the biopsy cavity. Follow-up 2-view mammogram was
performed and dictated separately.
IMPRESSION: Stereotactic-guided biopsy of left breast calcifications. No
apparent complications.

## 2022-07-01 ENCOUNTER — Other Ambulatory Visit: Payer: Self-pay | Admitting: Family Medicine

## 2022-07-01 DIAGNOSIS — Z1231 Encounter for screening mammogram for malignant neoplasm of breast: Secondary | ICD-10-CM

## 2022-08-19 ENCOUNTER — Ambulatory Visit
Admission: RE | Admit: 2022-08-19 | Discharge: 2022-08-19 | Disposition: A | Discharge status: Home / Self Care | Payer: Managed Care, Other (non HMO) | Source: Ambulatory Visit | Attending: Family Medicine | Admitting: Family Medicine

## 2022-08-19 DIAGNOSIS — Z1231 Encounter for screening mammogram for malignant neoplasm of breast: Secondary | ICD-10-CM

## 2022-08-22 ENCOUNTER — Other Ambulatory Visit: Payer: Self-pay | Admitting: Family Medicine

## 2022-08-22 DIAGNOSIS — R928 Other abnormal and inconclusive findings on diagnostic imaging of breast: Secondary | ICD-10-CM

## 2022-09-05 ENCOUNTER — Ambulatory Visit
Admission: RE | Admit: 2022-09-05 | Discharge: 2022-09-05 | Disposition: A | Discharge status: Home / Self Care | Payer: Managed Care, Other (non HMO) | Source: Ambulatory Visit | Attending: Family Medicine | Admitting: Family Medicine

## 2022-09-05 DIAGNOSIS — R928 Other abnormal and inconclusive findings on diagnostic imaging of breast: Secondary | ICD-10-CM

## 2023-08-06 ENCOUNTER — Other Ambulatory Visit: Payer: Self-pay | Admitting: Family Medicine

## 2023-08-06 DIAGNOSIS — Z1231 Encounter for screening mammogram for malignant neoplasm of breast: Secondary | ICD-10-CM

## 2023-08-20 ENCOUNTER — Ambulatory Visit
Admission: RE | Admit: 2023-08-20 | Discharge: 2023-08-20 | Disposition: A | Discharge status: Home / Self Care | Payer: Managed Care, Other (non HMO) | Source: Ambulatory Visit | Attending: Family Medicine

## 2023-08-20 DIAGNOSIS — Z1231 Encounter for screening mammogram for malignant neoplasm of breast: Secondary | ICD-10-CM
